# Patient Record
Sex: Female | Born: 1995 | Hispanic: Yes | Marital: Single | Smoking: Never smoker
Health system: Southern US, Community
[De-identification: ages and names within clinical notes are randomized; demographics above are authoritative.]

## PROBLEM LIST (undated history)

## (undated) DIAGNOSIS — E559 Vitamin D deficiency, unspecified: Secondary | ICD-10-CM

## (undated) DIAGNOSIS — E669 Obesity, unspecified: Secondary | ICD-10-CM

## (undated) DIAGNOSIS — G4730 Sleep apnea, unspecified: Secondary | ICD-10-CM

## (undated) HISTORY — DX: Vitamin D deficiency, unspecified: E55.9

---

## 2015-10-05 LAB — OB RESULTS CONSOLE RUBELLA ANTIBODY, IGM: RUBELLA: IMMUNE

## 2015-10-05 LAB — OB RESULTS CONSOLE GC/CHLAMYDIA
Chlamydia: NEGATIVE
Gonorrhea: NEGATIVE

## 2015-10-05 LAB — OB RESULTS CONSOLE ABO/RH: RH TYPE: POSITIVE

## 2015-10-05 LAB — OB RESULTS CONSOLE ANTIBODY SCREEN: Antibody Screen: NEGATIVE

## 2015-10-05 LAB — OB RESULTS CONSOLE RPR: RPR: NONREACTIVE

## 2015-10-05 LAB — OB RESULTS CONSOLE HIV ANTIBODY (ROUTINE TESTING): HIV: NONREACTIVE

## 2015-10-05 LAB — OB RESULTS CONSOLE HEPATITIS B SURFACE ANTIGEN: HEP B S AG: NEGATIVE

## 2016-03-16 LAB — OB RESULTS CONSOLE GBS: GBS: NEGATIVE

## 2016-04-06 ENCOUNTER — Inpatient Hospital Stay (HOSPITAL_COMMUNITY)
Admission: AD | Admit: 2016-04-06 | Discharge: 2016-04-10 | DRG: 765 | Disposition: A | Discharge status: Home / Self Care | Payer: Medicaid Other | Source: Ambulatory Visit | Attending: Obstetrics & Gynecology | Admitting: Obstetrics & Gynecology

## 2016-04-06 ENCOUNTER — Encounter (HOSPITAL_COMMUNITY): Payer: Self-pay | Admitting: *Deleted

## 2016-04-06 DIAGNOSIS — O34211 Maternal care for low transverse scar from previous cesarean delivery: Secondary | ICD-10-CM | POA: Diagnosis present

## 2016-04-06 DIAGNOSIS — Z8249 Family history of ischemic heart disease and other diseases of the circulatory system: Secondary | ICD-10-CM

## 2016-04-06 DIAGNOSIS — O99214 Obesity complicating childbirth: Secondary | ICD-10-CM | POA: Diagnosis present

## 2016-04-06 DIAGNOSIS — O4202 Full-term premature rupture of membranes, onset of labor within 24 hours of rupture: Secondary | ICD-10-CM | POA: Diagnosis present

## 2016-04-06 DIAGNOSIS — Z3483 Encounter for supervision of other normal pregnancy, third trimester: Secondary | ICD-10-CM

## 2016-04-06 DIAGNOSIS — Z3A38 38 weeks gestation of pregnancy: Secondary | ICD-10-CM

## 2016-04-06 DIAGNOSIS — O411230 Chorioamnionitis, third trimester, not applicable or unspecified: Secondary | ICD-10-CM | POA: Diagnosis present

## 2016-04-06 DIAGNOSIS — Z833 Family history of diabetes mellitus: Secondary | ICD-10-CM

## 2016-04-06 DIAGNOSIS — O4290 Premature rupture of membranes, unspecified as to length of time between rupture and onset of labor, unspecified weeks of gestation: Secondary | ICD-10-CM | POA: Diagnosis present

## 2016-04-06 DIAGNOSIS — Z98891 History of uterine scar from previous surgery: Secondary | ICD-10-CM

## 2016-04-06 DIAGNOSIS — Z6841 Body Mass Index (BMI) 40.0 and over, adult: Secondary | ICD-10-CM

## 2016-04-06 DIAGNOSIS — D649 Anemia, unspecified: Secondary | ICD-10-CM

## 2016-04-06 DIAGNOSIS — O4292 Full-term premature rupture of membranes, unspecified as to length of time between rupture and onset of labor: Secondary | ICD-10-CM

## 2016-04-06 DIAGNOSIS — E669 Obesity, unspecified: Secondary | ICD-10-CM

## 2016-04-06 HISTORY — DX: Obesity, unspecified: E66.9

## 2016-04-06 LAB — GROUP B STREP BY PCR: Group B strep by PCR: NEGATIVE

## 2016-04-06 LAB — CBC
HEMATOCRIT: 36.3 % (ref 36.0–46.0)
Hemoglobin: 11.9 g/dL — ABNORMAL LOW (ref 12.0–15.0)
MCH: 24.9 pg — AB (ref 26.0–34.0)
MCHC: 32.8 g/dL (ref 30.0–36.0)
MCV: 75.9 fL — ABNORMAL LOW (ref 78.0–100.0)
Platelets: 295 10*3/uL (ref 150–400)
RBC: 4.78 MIL/uL (ref 3.87–5.11)
RDW: 15.6 % — AB (ref 11.5–15.5)
WBC: 7.7 10*3/uL (ref 4.0–10.5)

## 2016-04-06 LAB — POCT FERN TEST

## 2016-04-06 LAB — RAPID HIV SCREEN (HIV 1/2 AB+AG)
HIV 1/2 Antibodies: NONREACTIVE
HIV-1 P24 ANTIGEN - HIV24: NONREACTIVE

## 2016-04-06 LAB — TYPE AND SCREEN
ABO/RH(D): B POS
ANTIBODY SCREEN: NEGATIVE

## 2016-04-06 LAB — ABO/RH: ABO/RH(D): B POS

## 2016-04-06 MED ORDER — ONDANSETRON HCL 4 MG/2ML IJ SOLN
4.0000 mg | Freq: Four times a day (QID) | INTRAMUSCULAR | Status: DC | PRN
Start: 2016-04-06 — End: 2016-04-08

## 2016-04-06 MED ORDER — TERBUTALINE SULFATE 1 MG/ML IJ SOLN: 0.2500 mg | Freq: Once | INTRAMUSCULAR | Status: DC | PRN

## 2016-04-06 MED ORDER — CITRIC ACID-SODIUM CITRATE 334-500 MG/5ML PO SOLN
30.0000 mL | ORAL | Status: DC | PRN
  Administered 2016-04-07: 30 mL via ORAL
  Filled 2016-04-06: qty 15

## 2016-04-06 MED ORDER — OXYTOCIN 40 UNITS IN LACTATED RINGERS INFUSION - SIMPLE MED
1.0000 m[IU]/min | INTRAVENOUS | Status: DC
  Administered 2016-04-06: 2 m[IU]/min via INTRAVENOUS
  Filled 2016-04-06: qty 1000

## 2016-04-06 MED ORDER — LACTATED RINGERS IV SOLN
500.0000 mL | INTRAVENOUS | Status: DC | PRN
  Administered 2016-04-07: 1000 mL via INTRAVENOUS

## 2016-04-06 MED ORDER — LIDOCAINE HCL (PF) 1 % IJ SOLN
30.0000 mL | INTRAMUSCULAR | Status: DC | PRN
  Filled 2016-04-06: qty 30

## 2016-04-06 MED ORDER — ACETAMINOPHEN 325 MG PO TABS
650.0000 mg | ORAL_TABLET | ORAL | Status: DC | PRN
  Administered 2016-04-07: 650 mg via ORAL
  Filled 2016-04-06 (×2): qty 2

## 2016-04-06 MED ORDER — LACTATED RINGERS IV SOLN
INTRAVENOUS | Status: DC
  Administered 2016-04-06 (×2): 125 mL/h via INTRAVENOUS
  Administered 2016-04-07 (×2): via INTRAVENOUS
  Administered 2016-04-07 (×2): 125 mL/h via INTRAVENOUS

## 2016-04-06 MED ORDER — OXYTOCIN 40 UNITS IN LACTATED RINGERS INFUSION - SIMPLE MED: 2.5000 [IU]/h | INTRAVENOUS | Status: DC

## 2016-04-06 MED ORDER — OXYTOCIN BOLUS FROM INFUSION: 500.0000 mL | INTRAVENOUS | Status: DC

## 2016-04-06 MED ORDER — FENTANYL CITRATE (PF) 100 MCG/2ML IJ SOLN
50.0000 ug | INTRAMUSCULAR | Status: DC | PRN
  Administered 2016-04-07: 100 ug via INTRAVENOUS
  Filled 2016-04-06: qty 2

## 2016-04-06 MED ORDER — PRENATAL MULTIVITAMIN CH
1.0000 | ORAL_TABLET | Freq: Every day | ORAL | Status: DC
Start: 2016-04-07 — End: 2016-04-08

## 2016-04-06 NOTE — Progress Notes (Signed)
Labor Progress Note Alexandra Gamble is a 20 y.o. G3P1011 at 7016w3d presented for PROM. S:  Doing well, no compliants, infrequent cramping.  O:  BP 117/53 mmHg  Pulse 82  Temp(Src) 97.9 F (36.6 C) (Oral)  Resp 20  Ht 5\' 6"  (1.676 m)  Wt 300 lb (136.079 kg)  BMI 48.44 kg/m2 EFM: 140/mod + a no decels Toco: infrequent  CVE: Dilation: 1 Effacement (%): 20 Cervical Position: Posterior Station: -3 Presentation: Vertex Exam by:: Shela Commons. Macklen Wilhoite, MD  @ 130pm, deferred currently    A&P: 20 y.o. G3P1011 5116w3d by 9wk scan scan who presented with PROM, now with FB in place, doing well w/o signs of labor so will start low dose pitocin for cervical ripening.  #Labor: no signs of labor s/p FB placement at 1330. Will start ripening with low dose pitocin. Anticipate natural delivery, will consider epidural. -consented for VBAC -membranes: SROM, clear fluid 0700 5.17 -start pitocin, not to exceed 536mu/m for ripening -will titrate pitocin when FB out  #FWB: category 1 strip, will start continuous monitoring with pitocin.  #GBS: negative, verified with external records and neg PCR upon admission  #Postpartum care: -feeding: breast -contraception: paraguard -vaccines: RI, VI, will ask patient about flu and tdap given none noted in prenatal records  Marina Goodellaroline Lynnex Fulp, MD 7:15 PM

## 2016-04-06 NOTE — Anesthesia Pain Management Evaluation Note (Signed)
  CRNA Pain Management Visit Note  Patient: Alexandra Gamble, 20 y.o., female  "Hello I am a member of the anesthesia team at [facility]. We have an anesthesia team available at all times to provide care throughout the hospital, including epidural management and anesthesia for C-section. I don't know your plan for the delivery whether it a natural birth, water birth, IV sedation, nitrous supplementation, doula or epidural, but we want to meet your pain goals."   1.Was your pain managed to your expectations on prior hospitalizations?   Yes   2.What is your expectation for pain management during this hospitalization?     Epidural  3.How can we help you reach that goal? Place epidural as soon as I can have it.  Record the patient's initial score and the patient's pain goal.   Pain: 2  Pain Goal: 7 [facility] wants you to be able to say your pain was always managed very well.  Margee Trentham 04/06/2016

## 2016-04-06 NOTE — H&P (Signed)
OBSTETRIC ADMISSION HISTORY AND PHYSICAL  Alexandra Gamble is a 20 y.o. female 263P1011 with IUP at 166w3d by first trimester scan presenting for VLOF. She reports +FMs,no VB.  She plans on breast feeding. She request paraguard for birth control.  Dating: By 1st trimester scan per report --->  Estimated Date of Delivery: 04/17/16  Sono:  Records not available   Prenatal History/Complications:  Past Medical History: Past Medical History  Diagnosis Date  . Obesity     Past Surgical History: Past Surgical History  Procedure Laterality Date  . Cesarean section      Obstetrical History: OB History    Gravida Para Term Preterm AB TAB SAB Ectopic Multiple Living   3 1 1  1  1   1       Social History: Social History   Social History  . Marital Status: Single    Spouse Name: N/A  . Number of Children: N/A  . Years of Education: N/A   Social History Main Topics  . Smoking status: Never Smoker   . Smokeless tobacco: None  . Alcohol Use: No  . Drug Use: No  . Sexual Activity: Not Asked   Other Topics Concern  . None   Social History Narrative  . None    Family History: Family History  Problem Relation Age of Onset  . Diabetes Maternal Grandmother   . Hypertension Maternal Grandmother   . Heart disease Maternal Grandmother     Allergies: No Known Allergies  Prescriptions prior to admission  Medication Sig Dispense Refill Last Dose  . Prenatal Vit-Fe Fumarate-FA (PRENATAL MULTIVITAMIN) TABS tablet Take 1 tablet by mouth daily at 12 noon.   04/05/2016 at Unknown time     Review of Systems   All systems reviewed and negative except as stated in HPI  Blood pressure 114/67, pulse 92, temperature 98 F (36.7 C), temperature source Oral, resp. rate 16. General appearance: alert Lungs: clear to auscultation bilaterally Heart: regular rate and rhythm Abdomen: soft, non-tender; bowel sounds normal Pelvic: deferred, completed by RN as outlined below Extremities:   no sign of DVT Presentation: cephalic by BSUS Fetal monitoring category 1 Uterine activityNone Dilation: Fingertip Effacement (%): 60 Station: -3 Exam by:: Morrison Oldee Carter RN   Prenatal labs: ABO, Rh:   Antibody:   Rubella: !Error! RPR:    HBsAg:    HIV:    GBS:    1 hr Glucola not avialable Genetic screening  Not available Anatomy US not available  Prenatal Transfer Tool  Maternal Diabetes: No Genetic Screening: unknown Maternal Ultrasounds/Referrals:  unknown Fetal Ultrasounds or other Referrals:  unknown Maternal Substance Abuse:  No Significant Maternal Medications:  None Significant Maternal Lab Results: None  Results for orders placed or performed during the hospital encounter of 04/06/16 (from the past 24 hour(s))  Fern Test   Collection Time: 04/06/16  8:57 AM  Result Value Ref Range   POCT Fern Test      There are no active problems to display for this patient.   Assessment: Alexandra Gamble is a 20 y.o. G3P1011 at 366w3d here for PROM in setting of history of C/S x1 who desires TOLAC  #Labor: TOLAC success 42% based upon exam today. Given PROM w/o labor contractions will induce with FB. Plan as follows -admit to L/D -FB placement -anticipate natural vaginal delivery, considering epidural, will get anesthesia consult -rpr/cbc/type and screen  #Pain: As above  #FWB: Category 1, 7.5lb by leopolds, vertex by BSUS -continuous monitoring  #  MOF: breast  #MOC: paraguard  #Circ:  NA  Marina Goodell, MD  04/06/2016, 11:01 AM

## 2016-04-06 NOTE — MAU Note (Signed)
Urine in lab 

## 2016-04-06 NOTE — Progress Notes (Signed)
Alexandra Gamble is a 20 y.o. G3P1011 at 8261w3d admitted for induction of labor due to PROM.  Subjective: Notes some pain with contractions. Verifies foley bulb still in place. Does not wish to use Epidural if possible. No further concerns at this time.  Objective: BP 146/86 mmHg  Pulse 89  Temp(Src) 98.6 F (37 C) (Oral)  Resp 20  Ht 5\' 6"  (1.676 m)  Wt 136.079 kg (300 lb)  BMI 48.44 kg/m2      FHT:  FHR: 150 bpm, variability: moderate,  accelerations:  Present,  decelerations:  Absent UC:   irregular SVE:   Dilation: 1 Effacement (%): 20 Station: -3 Exam by:: Shela Commons. Mullin, MD  Labs: Lab Results  Component Value Date   WBC 7.7 04/06/2016   HGB 11.9* 04/06/2016   HCT 36.3 04/06/2016   MCV 75.9* 04/06/2016   PLT 295 04/06/2016    Assessment / Plan: Induction of labor due to PROM,  progressing well on pitocin  Labor: Progressing normally. Continue current dose of Pitocin. Fetal Wellbeing:  Category I Pain Control:  Labor support without medications I/D:  n/a Anticipated MOD:  NSVD  [facility] Paolina Karwowski 04/06/2016, 11:41 PM

## 2016-04-06 NOTE — MAU Note (Addendum)
C/o ?SROM this Am around 0700; denies any pain; [place] scheduled for repeat c-section on 04/13/2016;

## 2016-04-07 ENCOUNTER — Inpatient Hospital Stay (HOSPITAL_COMMUNITY): Payer: Medicaid Other | Admitting: Anesthesiology

## 2016-04-07 ENCOUNTER — Encounter (HOSPITAL_COMMUNITY)
Admission: AD | Disposition: A | Discharge status: Home / Self Care | Payer: Self-pay | Source: Ambulatory Visit | Attending: Obstetrics & Gynecology

## 2016-04-07 ENCOUNTER — Encounter (HOSPITAL_COMMUNITY): Payer: Self-pay | Admitting: Certified Nurse Midwife

## 2016-04-07 DIAGNOSIS — O411230 Chorioamnionitis, third trimester, not applicable or unspecified: Secondary | ICD-10-CM

## 2016-04-07 DIAGNOSIS — O4202 Full-term premature rupture of membranes, onset of labor within 24 hours of rupture: Secondary | ICD-10-CM

## 2016-04-07 DIAGNOSIS — Z3A38 38 weeks gestation of pregnancy: Secondary | ICD-10-CM

## 2016-04-07 DIAGNOSIS — O99214 Obesity complicating childbirth: Secondary | ICD-10-CM

## 2016-04-07 DIAGNOSIS — O34211 Maternal care for low transverse scar from previous cesarean delivery: Secondary | ICD-10-CM

## 2016-04-07 LAB — RPR: RPR: NONREACTIVE

## 2016-04-07 SURGERY — Surgical Case
Anesthesia: Epidural

## 2016-04-07 MED ORDER — MORPHINE SULFATE (PF) 0.5 MG/ML IJ SOLN
INTRAMUSCULAR | Status: AC
  Filled 2016-04-07: qty 10

## 2016-04-07 MED ORDER — MORPHINE SULFATE (PF) 0.5 MG/ML IJ SOLN
INTRAMUSCULAR | Status: DC | PRN
  Administered 2016-04-07: 4 mg via EPIDURAL
  Administered 2016-04-07: 1 mg via INTRAVENOUS

## 2016-04-07 MED ORDER — ONDANSETRON HCL 4 MG/2ML IJ SOLN
INTRAMUSCULAR | Status: AC
  Filled 2016-04-07: qty 2

## 2016-04-07 MED ORDER — FENTANYL CITRATE (PF) 250 MCG/5ML IJ SOLN
INTRAMUSCULAR | Status: AC
  Filled 2016-04-07: qty 5

## 2016-04-07 MED ORDER — LACTATED RINGERS IV BOLUS (SEPSIS): 1000.0000 mL | Freq: Once | INTRAVENOUS | Status: DC

## 2016-04-07 MED ORDER — DEXAMETHASONE SODIUM PHOSPHATE 4 MG/ML IJ SOLN
INTRAMUSCULAR | Status: AC
  Filled 2016-04-07: qty 1

## 2016-04-07 MED ORDER — BUTORPHANOL TARTRATE 1 MG/ML IJ SOLN: 1.0000 mg | INTRAMUSCULAR | Status: DC | PRN

## 2016-04-07 MED ORDER — MEPERIDINE HCL 25 MG/ML IJ SOLN
INTRAMUSCULAR | Status: DC | PRN
  Administered 2016-04-07 (×2): 12.5 mg via INTRAVENOUS

## 2016-04-07 MED ORDER — DEXTROSE 5 % IV SOLN
3.0000 g | INTRAVENOUS | Status: DC | PRN
  Administered 2016-04-07: 3 g via INTRAVENOUS

## 2016-04-07 MED ORDER — GENTAMICIN SULFATE 40 MG/ML IJ SOLN: 5.0000 mg/kg | INTRAVENOUS | Status: DC

## 2016-04-07 MED ORDER — CEFAZOLIN SODIUM 10 G IJ SOLR
INTRAMUSCULAR | Status: AC
  Filled 2016-04-07: qty 3000

## 2016-04-07 MED ORDER — OXYTOCIN 10 UNIT/ML IJ SOLN
INTRAMUSCULAR | Status: AC
  Filled 2016-04-07: qty 4

## 2016-04-07 MED ORDER — ACETAMINOPHEN 325 MG PO TABS: 975.0000 mg | ORAL_TABLET | Freq: Three times a day (TID) | ORAL | Status: DC | PRN

## 2016-04-07 MED ORDER — MEPERIDINE HCL 25 MG/ML IJ SOLN
INTRAMUSCULAR | Status: AC
  Filled 2016-04-07: qty 1

## 2016-04-07 MED ORDER — EPHEDRINE 5 MG/ML INJ: 10.0000 mg | INTRAVENOUS | Status: DC | PRN

## 2016-04-07 MED ORDER — FENTANYL CITRATE (PF) 100 MCG/2ML IJ SOLN
INTRAMUSCULAR | Status: DC | PRN
  Administered 2016-04-07: 100 ug via EPIDURAL

## 2016-04-07 MED ORDER — LACTATED RINGERS IV SOLN: 500.0000 mL | Freq: Once | INTRAVENOUS | Status: DC

## 2016-04-07 MED ORDER — BUTORPHANOL TARTRATE 1 MG/ML IJ SOLN
1.0000 mg | INTRAMUSCULAR | Status: DC | PRN
  Administered 2016-04-07: 1 mg via INTRAVENOUS
  Filled 2016-04-07: qty 1

## 2016-04-07 MED ORDER — METHYLERGONOVINE MALEATE 0.2 MG/ML IJ SOLN
INTRAMUSCULAR | Status: DC | PRN
  Administered 2016-04-07: 0.2 mg via INTRAMUSCULAR

## 2016-04-07 MED ORDER — ACETAMINOPHEN 325 MG PO TABS
325.0000 mg | ORAL_TABLET | Freq: Once | ORAL | Status: AC
  Administered 2016-04-07: 325 mg via ORAL

## 2016-04-07 MED ORDER — DIPHENHYDRAMINE HCL 50 MG/ML IJ SOLN
12.5000 mg | INTRAMUSCULAR | Status: DC | PRN
  Administered 2016-04-07: 12.5 mg via INTRAVENOUS
  Filled 2016-04-07: qty 1

## 2016-04-07 MED ORDER — FENTANYL CITRATE (PF) 100 MCG/2ML IJ SOLN: 100.0000 ug | INTRAMUSCULAR | Status: DC | PRN

## 2016-04-07 MED ORDER — SODIUM BICARBONATE 8.4 % IV SOLN
INTRAVENOUS | Status: AC
  Filled 2016-04-07: qty 50

## 2016-04-07 MED ORDER — PHENYLEPHRINE 40 MCG/ML (10ML) SYRINGE FOR IV PUSH (FOR BLOOD PRESSURE SUPPORT)
80.0000 ug | PREFILLED_SYRINGE | INTRAVENOUS | Status: DC | PRN
  Filled 2016-04-07: qty 10

## 2016-04-07 MED ORDER — PHENYLEPHRINE 40 MCG/ML (10ML) SYRINGE FOR IV PUSH (FOR BLOOD PRESSURE SUPPORT): 80.0000 ug | PREFILLED_SYRINGE | INTRAVENOUS | Status: DC | PRN

## 2016-04-07 MED ORDER — KETOROLAC TROMETHAMINE 30 MG/ML IJ SOLN: 30.0000 mg | Freq: Four times a day (QID) | INTRAMUSCULAR | Status: DC | PRN

## 2016-04-07 MED ORDER — LIDOCAINE-EPINEPHRINE (PF) 2 %-1:200000 IJ SOLN
INTRAMUSCULAR | Status: AC
  Filled 2016-04-07: qty 20

## 2016-04-07 MED ORDER — LACTATED RINGERS IV BOLUS (SEPSIS)
1000.0000 mL | Freq: Once | INTRAVENOUS | Status: AC
  Administered 2016-04-07: 1000 mL via INTRAVENOUS

## 2016-04-07 MED ORDER — FENTANYL CITRATE (PF) 100 MCG/2ML IJ SOLN: 25.0000 ug | INTRAMUSCULAR | Status: DC | PRN

## 2016-04-07 MED ORDER — SODIUM CHLORIDE 0.9 % IR SOLN
Status: DC | PRN
  Administered 2016-04-07: 1000 mL

## 2016-04-07 MED ORDER — FENTANYL 2.5 MCG/ML BUPIVACAINE 1/10 % EPIDURAL INFUSION (WH - ANES)
14.0000 mL/h | INTRAMUSCULAR | Status: DC | PRN
  Administered 2016-04-07 (×3): 14 mL/h via EPIDURAL
  Filled 2016-04-07 (×3): qty 125

## 2016-04-07 MED ORDER — SODIUM CHLORIDE 0.9 % IV SOLN
2.0000 g | Freq: Four times a day (QID) | INTRAVENOUS | Status: DC
  Administered 2016-04-07: 2 g via INTRAVENOUS
  Filled 2016-04-07 (×5): qty 2000

## 2016-04-07 MED ORDER — DEXTROSE 5 % IV SOLN
200.0000 mg | Freq: Three times a day (TID) | INTRAVENOUS | Status: DC
  Administered 2016-04-07: 200 mg via INTRAVENOUS
  Filled 2016-04-07 (×3): qty 5

## 2016-04-07 MED ORDER — SCOPOLAMINE 1 MG/3DAYS TD PT72
MEDICATED_PATCH | TRANSDERMAL | Status: AC
  Filled 2016-04-07: qty 1

## 2016-04-07 MED ORDER — MEPERIDINE HCL 25 MG/ML IJ SOLN: 6.2500 mg | INTRAMUSCULAR | Status: DC | PRN

## 2016-04-07 MED ORDER — PROCHLORPERAZINE EDISYLATE 5 MG/ML IJ SOLN
10.0000 mg | Freq: Once | INTRAMUSCULAR | Status: DC | PRN
Start: 2016-04-07 — End: 2016-04-08

## 2016-04-07 MED ORDER — BUPIVACAINE HCL (PF) 0.5 % IJ SOLN
INTRAMUSCULAR | Status: DC | PRN
  Administered 2016-04-07: 30 mL

## 2016-04-07 MED ORDER — LIDOCAINE HCL (PF) 1 % IJ SOLN
INTRAMUSCULAR | Status: DC | PRN
  Administered 2016-04-07 (×2): 5 mL

## 2016-04-07 MED ORDER — BUPIVACAINE HCL (PF) 0.5 % IJ SOLN
INTRAMUSCULAR | Status: AC
  Filled 2016-04-07: qty 30

## 2016-04-07 MED ORDER — OXYTOCIN 10 UNIT/ML IJ SOLN
40.0000 [IU] | INTRAVENOUS | Status: DC | PRN
  Administered 2016-04-07: 40 [IU] via INTRAVENOUS

## 2016-04-07 MED ORDER — ONDANSETRON HCL 4 MG/2ML IJ SOLN
INTRAMUSCULAR | Status: DC | PRN
  Administered 2016-04-07: 4 mg via INTRAVENOUS

## 2016-04-07 MED ORDER — METHYLERGONOVINE MALEATE 0.2 MG/ML IJ SOLN
INTRAMUSCULAR | Status: AC
  Filled 2016-04-07: qty 1

## 2016-04-07 MED ORDER — SODIUM BICARBONATE 8.4 % IV SOLN
INTRAVENOUS | Status: DC | PRN
  Administered 2016-04-07: 3 mL via EPIDURAL
  Administered 2016-04-07: 2 mL via EPIDURAL
  Administered 2016-04-07 (×3): 5 mL via EPIDURAL

## 2016-04-07 SURGICAL SUPPLY — 39 items
BENZOIN TINCTURE PRP APPL 2/3 (GAUZE/BANDAGES/DRESSINGS) ×2 IMPLANT
BLADE TIP J-PLASMA PRECISE LAP (MISCELLANEOUS) ×2 IMPLANT
CHLORAPREP W/TINT 26ML (MISCELLANEOUS) ×2 IMPLANT
CLAMP CORD UMBIL (MISCELLANEOUS) ×2 IMPLANT
CLOTH BEACON ORANGE TIMEOUT ST (SAFETY) ×2 IMPLANT
DRESSING DISP NPWT PICO 4X12 (MISCELLANEOUS) ×2 IMPLANT
DRSG OPSITE POSTOP 4X10 (GAUZE/BANDAGES/DRESSINGS) ×2 IMPLANT
ELECT REM PT RETURN 9FT ADLT (ELECTROSURGICAL) ×2
ELECTRODE REM PT RTRN 9FT ADLT (ELECTROSURGICAL) ×1 IMPLANT
EXTRACTOR VACUUM KIWI (MISCELLANEOUS) IMPLANT
GLOVE BIO SURGEON STRL SZ7 (GLOVE) ×8 IMPLANT
GLOVE BIOGEL PI IND STRL 7.0 (GLOVE) ×4 IMPLANT
GLOVE BIOGEL PI INDICATOR 7.0 (GLOVE) ×4
GOWN STRL REUS W/TWL LRG LVL3 (GOWN DISPOSABLE) ×6 IMPLANT
GOWN STRL REUS W/TWL XL LVL3 (GOWN DISPOSABLE) ×2 IMPLANT
HEMOSTAT ARISTA ABSORB 3G PWDR (MISCELLANEOUS) ×2 IMPLANT
KIT ABG SYR 3ML LUER SLIP (SYRINGE) IMPLANT
NEEDLE HYPO 22GX1.5 SAFETY (NEEDLE) ×2 IMPLANT
NEEDLE HYPO 25X5/8 SAFETYGLIDE (NEEDLE) IMPLANT
NS IRRIG 1000ML POUR BTL (IV SOLUTION) ×2 IMPLANT
PACK C SECTION WH (CUSTOM PROCEDURE TRAY) ×2 IMPLANT
PAD OB MATERNITY 4.3X12.25 (PERSONAL CARE ITEMS) ×2 IMPLANT
PENCIL SMOKE EVAC W/HOLSTER (ELECTROSURGICAL) ×2 IMPLANT
RETRACTOR TRAXI PANNICULUS (MISCELLANEOUS) ×1 IMPLANT
RTRCTR C-SECT PINK 25CM LRG (MISCELLANEOUS) IMPLANT
SPONGE SURGIFOAM ABS GEL 12-7 (HEMOSTASIS) IMPLANT
STRIP CLOSURE SKIN 1/2X4 (GAUZE/BANDAGES/DRESSINGS) ×2 IMPLANT
SUT PDS AB 0 CTX 60 (SUTURE) IMPLANT
SUT PLAIN 0 NONE (SUTURE) IMPLANT
SUT PLAIN 2 0 XLH (SUTURE) ×2 IMPLANT
SUT SILK 0 TIES 10X30 (SUTURE) IMPLANT
SUT VIC AB 0 CT1 36 (SUTURE) ×10 IMPLANT
SUT VIC AB 3-0 CT1 27 (SUTURE) ×1
SUT VIC AB 3-0 CT1 TAPERPNT 27 (SUTURE) ×1 IMPLANT
SUT VIC AB 4-0 KS 27 (SUTURE) IMPLANT
SYR CONTROL 10ML LL (SYRINGE) ×2 IMPLANT
TOWEL OR 17X24 6PK STRL BLUE (TOWEL DISPOSABLE) ×2 IMPLANT
TRAXI PANNICULUS RETRACTOR (MISCELLANEOUS) ×1
TRAY FOLEY CATH SILVER 14FR (SET/KITS/TRAYS/PACK) ×2 IMPLANT

## 2016-04-07 NOTE — Anesthesia Procedure Notes (Signed)
Epidural Patient location during procedure: OB  Staffing Anesthesiologist: Phillips GroutARIGNAN, Corneshia Hines Performed by: anesthesiologist   Preanesthetic Checklist Completed: patient identified, site marked, surgical consent, pre-op evaluation, timeout performed, IV checked, risks and benefits discussed and monitors and equipment checked  Epidural Patient position: sitting Prep: DuraPrep Patient monitoring: heart rate, continuous pulse ox and blood pressure Approach: midline Location: L4-L5 Injection technique: LOR saline  Needle:  Needle type: Tuohy  Needle gauge: 17 G Needle length: 9 cm and 9 Needle insertion depth: 9 cm Catheter type: closed end flexible Catheter size: 20 Guage Catheter at skin depth: 13 cm Test dose: negative  Assessment Events: blood not aspirated, injection not painful, no injection resistance, negative IV test and no paresthesia  Additional Notes Patient identified. Risks/Benefits/Options discussed with patient including but not limited to bleeding, infection, nerve damage, paralysis, failed block, incomplete pain control, headache, blood pressure changes, nausea, vomiting, reactions to medication both or allergic, itching and postpartum back pain. Confirmed with bedside nurse the patient's most recent platelet count. Confirmed with patient that they are not currently taking any anticoagulation, have any bleeding history or any family history of bleeding disorders. Patient expressed understanding and wished to proceed. All questions were answered. Sterile technique was used throughout the entire procedure. Please see nursing notes for vital signs. Test dose was given through epidural needle and negative prior to continuing to dose epidural or start infusion. Warning signs of high block given to the patient including shortness of breath, tingling/numbness in hands, complete motor block, or any concerning symptoms with instructions to call for help. Patient was given  instructions on fall risk and not to get out of bed. All questions and concerns addressed with instructions to call with any issues.

## 2016-04-07 NOTE — Anesthesia Preprocedure Evaluation (Addendum)
Anesthesia Evaluation  Patient identified by MRN, date of birth, ID band Patient awake    Reviewed: Allergy & Precautions, H&P , NPO status , Patient's Chart, lab work & pertinent test results  History of Anesthesia Complications Negative for: history of anesthetic complications  Airway Mallampati: II  TM Distance: >3 FB Neck ROM: full    Dental no notable dental hx. (+) Teeth Intact   Pulmonary neg pulmonary ROS,    Pulmonary exam normal breath sounds clear to auscultation       Cardiovascular negative cardio ROS Normal cardiovascular exam Rhythm:regular Rate:Normal     Neuro/Psych negative neurological ROS  negative psych ROS   GI/Hepatic negative GI ROS, Neg liver ROS,   Endo/Other  Morbid obesity  Renal/GU negative Renal ROS  negative genitourinary   Musculoskeletal   Abdominal   Peds  Hematology negative hematology ROS (+)   Anesthesia Other Findings   Reproductive/Obstetrics (+) Pregnancy                             Anesthesia Physical Anesthesia Plan  ASA: III  Anesthesia Plan: Epidural   Post-op Pain Management:    Induction:   Airway Management Planned:   Additional Equipment:   Intra-op Plan:   Post-operative Plan:   Informed Consent: I have reviewed the patients History and Physical, chart, labs and discussed the procedure including the risks, benefits and alternatives for the proposed anesthesia with the patient or authorized representative who has indicated his/her understanding and acceptance.     Plan Discussed with:   Anesthesia Plan Comments: (2130: CS called for failure to progress. Epidural has been working well and will be used for CS. Discussed with patient.)       Anesthesia Quick Evaluation

## 2016-04-07 NOTE — Progress Notes (Signed)
Pharmacy Antibiotic Note  Alexandra Gamble is a 20 y.o. female 3562w4d admitted on 04/06/2016 now in active labor with Triple I. Pharmacy has been consulted for gentamicin dosing.  Plan: Start gentamicin 200 mg IV q8h Ampicillin 2 g IV q6h per MD  Monitor Scr and levels as needed   Height: 5\' 6"  (167.6 cm) Weight: 300 lb (136.079 kg) IBW/kg (Calculated) : 59.3  Temp (24hrs), Avg:98.3 F (36.8 C), Min:97.8 F (36.6 C), Max:100.3 F (37.9 C)   Recent Labs Lab 04/06/16 1130  WBC 7.7    CrCl cannot be calculated (Patient has no serum creatinine result on file.).    No Known Allergies  Antimicrobials this admission: Ampicillin 05/18 >> Gentamicin 05/18 >>    Thank you for allowing pharmacy to be a part of this patient's care.  Tawny HoppingBrittany R Jenayah Antu, PharmD Candidate  04/07/2016 2:16 PM

## 2016-04-07 NOTE — Progress Notes (Signed)
Alexandra Gamble is a 20 y.o. G3P1011 at 9363w4d admitted for induction of labor due to PROM.  Subjective: Notes some pain. Does not wish for epidural.   Objective: BP 140/80 mmHg  Pulse 105  Temp(Src) 98 F (36.7 C) (Oral)  Resp 20  Ht 5\' 6"  (1.676 m)  Wt 136.079 kg (300 lb)  BMI 48.44 kg/m2      FHT:  FHR: 140 bpm, variability: moderate,  accelerations:  Present,  decelerations:  Absent UC:   regular SVE:   Dilation: 5 Effacement (%): 60 Station: -2 Exam by:: Alexandra MallowKlashley, RN  Labs: Lab Results  Component Value Date   WBC 7.7 04/06/2016   HGB 11.9* 04/06/2016   HCT 36.3 04/06/2016   MCV 75.9* 04/06/2016   PLT 295 04/06/2016    Assessment / Plan: Induction of labor due to PROM,  progressing well on pitocin  Labor: Progressing normally. Foley bulb out. Preeclampsia:  no signs or symptoms of toxicity Fetal Wellbeing:  Category I Pain Control:  IV pain meds I/D:  n/a Anticipated MOD:  NSVD  [name] 04/07/2016, 3:02 AM

## 2016-04-07 NOTE — Progress Notes (Signed)
Alexandra Gamble is a 20 y.o. G3P1011 at 4778w4d admitted for induction of labor due to PROM.  Subjective: Continues to note significant pain. Wishes for epidural.  Objective: BP 137/94 mmHg  Pulse 126  Temp(Src) 98 F (36.7 C) (Oral)  Resp 20  Ht 5\' 6"  (1.676 m)  Wt 136.079 kg (300 lb)  BMI 48.44 kg/m2      FHT:  Off fetal monitor for epidural placement UC:  Regular SVE:   Dilation: 6 Effacement (%): 80, 90 Station: -2 Exam by:: KB Home	[place]  Labs: Lab Results  Component Value Date   WBC 7.7 04/06/2016   HGB 11.9* 04/06/2016   HCT 36.3 04/06/2016   MCV 75.9* 04/06/2016   PLT 295 04/06/2016    Assessment / Plan: Induction of labor due to PROM,  progressing well on pitocin  Labor: Progressing normally. Continue pitocin. Preeclampsia:  no signs or symptoms of toxicity Fetal Wellbeing:  Category I Pain Control:  Epidural being placed I/D:  n/a Anticipated MOD:  NSVD  [facility] Cordella Nyquist 04/07/2016, 6:44 AM

## 2016-04-07 NOTE — H&P (Signed)
Neonatology Note:   Attendance at C-section:    I was asked by Dr. Erin FullingHarraway-Smith to attend this repeat C/S at term for FTP and prolonged ROM also complicated by chorioamnionitis with Amp-Gent. The mother is a 20 y.o. female G3P1011  GBS negative with good prenatal care. ROM ~39h hours before delivery, fluid clear. Infant vigorous with good spontaneous cry and tone. Needed only minimal bulb suctioning. Ap 8/9. Lungs clear to ausc in DR. EOS Risk @ Birth is 1.60; low threshold for starting antibiotics if clinical concerns. To CN to care of Pediatrician.  Dineen Kidavid C. Leary RocaEhrmann, MD

## 2016-04-07 NOTE — Progress Notes (Signed)
Labor Progress Note Alexandra Gamble is a 20 y.o. G3P1011 at 7326w4d presented for PROM, now in induced latent labor. Pt is TOLACing.  S:  Doing well s/p epidural, no complaints  O:  BP 128/76 mmHg  Pulse 92  Temp(Src) 97.8 F (36.6 C) (Oral)  Resp 18  Ht 5\' 6"  (1.676 m)  Wt 300 lb (136.079 kg)  BMI 48.44 kg/m2  SpO2 95% EFM: 140/mod var/+accels, no decels Ctx: q2-8075m  CVE: Dilation: 6 Effacement (%): 90 Cervical Position: Posterior Station: -2 Presentation: Vertex Exam by:: Dr. Burton ApleyMullin IUPC placed  A&P: 20 y.o. [id number] 5226w4d who presented with PROM, now in induced latent labor w protracted labor course so IUPC was successfully placed.  #Labor/TOLAC: induced latent labor and labor curve is protracted given no cervical change for 4 hours. Suspect protraction related to inadequate contractions though tocometry was insufficient to assess frequency of contractions. As such IUPC placed and will continue titrate pitocin. -titrate pitocin, RN to notify MD if 20 is reach -will monitor MVUs -s/p epidural for pain  #FWB: category 1  #GBS: negative   Alexandra Goodellaroline Kennita Pavlovich, MD 10:13 AM

## 2016-04-07 NOTE — Brief Op Note (Signed)
04/06/2016 - 04/07/2016  11:50 PM  PATIENT:  Alexandra Gamble  20 y.o. female  PRE-OPERATIVE DIAGNOSIS:  Failure to progress; Failed induction; Suspected Triple I; Repeat cesarean section  POST-OPERATIVE DIAGNOSIS:  Failure to progress; Failed induction; Suspected Triple I; Repeat cesarean section  PROCEDURE:  Procedure(s): CESAREAN SECTION (N/A)  SURGEON:  Surgeon(s) and Role:    * Willodean Rosenthalarolyn Harraway-Smith, MD - Primary    * Federico FlakeKimberly Niles Newton, MD - Assisting  ANESTHESIA:   epidural  EBL:  Total I/O In: 1800 [I.V.:1800] Out: 2000 [Urine:900; Blood:1100]  BLOOD ADMINISTERED:none  DRAINS: none   LOCAL MEDICATIONS USED:  MARCAINE     SPECIMEN:  Source of Specimen:  placenta  DISPOSITION OF SPECIMEN:  PATHOLOGY  COUNTS:  YES  TOURNIQUET:  * No tourniquets in log *  DICTATION: .Note written in EPIC  PLAN OF CARE: Admit to inpatient   PATIENT DISPOSITION:  PACU - hemodynamically stable.   Delay start of Pharmacological VTE agent (>24hrs) due to surgical blood loss or risk of bleeding: yes   Suliman Termini L. Harraway-Smith, M.D., Evern CoreFACOG

## 2016-04-07 NOTE — Op Note (Signed)
04/06/2016 - 04/07/2016  11:50 PM  PATIENT:  Alexandra Gamble  20 y.o. female  PRE-OPERATIVE DIAGNOSIS:  Failure to progress; Failed induction; Suspected Triple I; Repeat cesarean section  POST-OPERATIVE DIAGNOSIS:  Failure to progress; Failed induction; Suspected Triple I; Repeat cesarean section  PROCEDURE:  Procedure(s): CESAREAN SECTION (N/A)  SURGEON:  Surgeon(s) and Role:    * Willodean Rosenthal, MD - Primary    * Federico Flake, MD - Assisting  ANESTHESIA:   epidural  EBL:  Total I/O In: 1800 [I.V.:1800] Out: 2000 [Urine:900; Blood:1100]  BLOOD ADMINISTERED:none  DRAINS: none   LOCAL MEDICATIONS USED:  MARCAINE     SPECIMEN:  Source of Specimen:  placenta  DISPOSITION OF SPECIMEN:  PATHOLOGY  COUNTS:  YES  TOURNIQUET:  * No tourniquets in log *  DICTATION: .Note written in EPIC  PLAN OF CARE: Admit to inpatient   PATIENT DISPOSITION:  PACU - hemodynamically stable.   Delay start of Pharmacological VTE agent (>24hrs) due to surgical blood loss or risk of bleeding: yes  Complications: none immediate   INDICATIONS: [name] is a 20 y.o. G3P2011 at [redacted]w[redacted]d here for cesarean section secondary to the indications listed under preoperative diagnosis; please see preoperative note for further details.  The risks of cesarean section were discussed with the patient including but were not limited to: bleeding which may require transfusion or reoperation; infection which may require antibiotics; injury to bowel, bladder, ureters or other surrounding organs; injury to the fetus; need for additional procedures including hysterectomy in the event of a life-threatening hemorrhage; placental abnormalities wth subsequent pregnancies, incisional problems, thromboembolic phenomenon and other postoperative/anesthesia complications.   The patient concurred with the proposed plan, giving informed written consent for the procedure.    FINDINGS:  Viable female infant in  cephalic presentation.  Apgars pending.  Cloudy amniotic fluid.  Intact placenta, three vessel cord.  Normal uterus, fallopian tubes and ovaries bilaterally.  PROCEDURE IN DETAIL:  The patient preoperatively received intravenous antibiotics and had sequential compression devices applied to her lower extremities.  She was then taken to the operating room where spinal anesthesia was administered and was found to be adequate. She was then placed in a dorsal supine position with a leftward tilt, and prepped and draped in a sterile manner.  A foley catheter was placed into her bladder and attached to constant gravity.  After an adequate timeout was performed, a Pfannenstiel skin incision was made with scalpel and carried through to the underlying layer of fascia. The fascia was incised in the midline, and this incision was extended bilaterally using the Mayo scissors.  Kocher clamps were applied to the superior aspect of the fascial incision and the underlying rectus muscles were dissected off bluntly. A similar process was carried out on the inferior aspect of the fascial incision. The rectus muscles were separated in the midline bluntly and the peritoneum was entered bluntly.  Attention was turned to the lower uterine segment where a low transverse hysterotomy incision was made with a scalpel and extended bilaterally bluntly.  The infant was successfully delivered, the cord was clamped and cut and the infant was handed over to awaiting neonatology team. The placenta was delivered manually. Uterine massage was then administered.  The uterus was slightly boggy so Methergine was given by anesthesia.  The placenta was intact with a three-vessel cord. The uterus was then cleared of clot and debris.  The hysterotomy was closed with 0 Vicryl in a running locked fashion, there  was an extension of the right lower uterine incision which was bleeding.  This was grasped with Alis clamps and reapproximated using several  figure-of-eight sutures until hemostasis was achieved.  An imbricating layer of 0 vicryl was placed. The uterus was returned to the pelvis. The pelvis was cleared of all clot and debris. Hemostasis was confirmed on all surfaces. Arista was placed over the uterine incision.  The peritoneum and the muscles were reapproximated using 0 Vicryl with 1 interrupted suture. The fascia was then closed using 0 Vicryl.  The subcutaneous layer was irrigated, then reapproximated with 3-0 vicryl in a running fashion, and the skin was closed with a 4-0 Vicryl subcuticular stitch.  30 cc of 0.5% marcaine was injected into the incision.  A PICO wound vac was placed over the incision. The patient tolerated the procedure well. Sponge, lap, instrument and needle counts were correct x 2.  She was taken to the recovery room in stable condition.   [name], M.D., Evern CoreFACOG

## 2016-04-07 NOTE — Progress Notes (Signed)
Labor Progress Note Alexandra Gamble is a 20 y.o. G3P1011 at 4517w4d presented for PROM, now in induced active labor.   S:  Shaking is persistent. No other concerns.   O:  BP 140/62 mmHg  Pulse 138  Temp(Src) 100.3 F (37.9 C) (Axillary)  Resp 20  Ht 5\' 6"  (1.676 m)  Wt 300 lb (136.079 kg)  BMI 48.44 kg/m2  SpO2 95% EFM: 180 mod var, + a, no decels  Ctx: q 2-4422m, MVU < 200  CVE: Dilation: 8 Effacement (%): 90 Cervical Position: Posterior Station: -1 Presentation: Vertex Exam by:: Valentina Lucks. Woods, RN   A&P: 20 y.o. [id number] 6317w4d who p/w PROM, now in induced active labor with maternal tachycardia, fetal tachycardia and elevated axillary temperature consistent with chorioamnionitis.  #Chorioamnionitis: acute, dx based upon fetal and maternal tachycardia coupled with maternal axillary temp of 100.18F. Plan as follow: -1L LR x1 now, then reassess -start amp 2g q6h -gent per pharm dosing q8h  #Labor/TOLAC: labor curve reviewed and appropriate. Ctx not adequate after decreased pit dosing so will plan to inc pitocin until MVU >200.  -continue to titrate pitocin, RN will notify MD if max at 20 -epidural in place -anticipate NSVD -continue IUPC  #FWB: category II given fetal tachycardia, likely 2/2 chorio and dehydration so will tx chorio as outlined above -continue FSE  #GBS: neg  Marina Goodellaroline Mullin, MD 1:38 PM  OB fellow attestation: I have seen and examined this patient; I agree with above documentation in the resident's note.  Agree with diagnosis of Triple I given maternal/fetal tachycardia and fever. Agree with of ABX treatment, fluid resuscitation and antipyretics,.   Federico FlakeKimberly Niles Kenzel Ruesch, MD 4:10 PM

## 2016-04-07 NOTE — Progress Notes (Signed)
Alexandra Gamble is a 20 y.o. G3P1011 at 1069w4d by LMP admitted for PROM  Subjective:  Pt without complaints.  Very comfortable.  Objective: BP 112/54 mmHg  Pulse 109  Temp(Src) 99.2 F (37.3 C) (Axillary)  Resp 20  Ht 5\' 6"  (1.676 m)  Wt 300 lb (136.079 kg)  BMI 48.44 kg/m2  SpO2 95%   Total I/O In: -  Out: 200 [Urine:200]  FHT:  FHR: 150's bpm, variability: moderate,  accelerations:  Present,  decelerations:  Present Cat I UC:   regular, every 2-3 minutes SVE:   Dilation: 8.5 Effacement (%): 90 Station: -2, -3 Exam by:: Dr. Erin FullingHarraway-Smith  Labs: Lab Results  Component Value Date   WBC 7.7 04/06/2016   HGB 11.9* 04/06/2016   HCT 36.3 04/06/2016   MCV 75.9* 04/06/2016   PLT 295 04/06/2016    Assessment / Plan: Protracted active phase.  Will recheck 2 more hours.   Fetal Wellbeing:  Category I Pain Control:  Epidural I/D:  n/a Anticipated MOD:  Pt with protracted labor complicated by Triple I.  If FHR remains stable will recheck in 2 hours and if no decent will proceed to operative delivery.Marland Kitchen.  HARRAWAY-SMITH, Alexandra Gamble 04/07/2016, 5:59 PM

## 2016-04-08 ENCOUNTER — Encounter (HOSPITAL_COMMUNITY): Payer: Self-pay

## 2016-04-08 LAB — CBC
HEMATOCRIT: 30.4 % — AB (ref 36.0–46.0)
HEMOGLOBIN: 9.7 g/dL — AB (ref 12.0–15.0)
MCH: 24.4 pg — ABNORMAL LOW (ref 26.0–34.0)
MCHC: 31.9 g/dL (ref 30.0–36.0)
MCV: 76.4 fL — ABNORMAL LOW (ref 78.0–100.0)
Platelets: 273 10*3/uL (ref 150–400)
RBC: 3.98 MIL/uL (ref 3.87–5.11)
RDW: 16 % — AB (ref 11.5–15.5)
WBC: 21.8 10*3/uL — AB (ref 4.0–10.5)

## 2016-04-08 MED ORDER — ACETAMINOPHEN 500 MG PO TABS
1000.0000 mg | ORAL_TABLET | Freq: Four times a day (QID) | ORAL | Status: AC
  Administered 2016-04-08 (×2): 1000 mg via ORAL
  Filled 2016-04-08 (×3): qty 2

## 2016-04-08 MED ORDER — COCONUT OIL OIL
1.0000 | TOPICAL_OIL | Status: DC | PRN
Start: 2016-04-08 — End: 2016-04-10

## 2016-04-08 MED ORDER — DIPHENHYDRAMINE HCL 25 MG PO CAPS: 25.0000 mg | ORAL_CAPSULE | ORAL | Status: DC | PRN

## 2016-04-08 MED ORDER — SENNOSIDES-DOCUSATE SODIUM 8.6-50 MG PO TABS
2.0000 | ORAL_TABLET | ORAL | Status: DC
  Administered 2016-04-08 – 2016-04-10 (×2): 2 via ORAL
  Filled 2016-04-08 (×2): qty 2

## 2016-04-08 MED ORDER — SCOPOLAMINE 1 MG/3DAYS TD PT72
1.0000 | MEDICATED_PATCH | Freq: Once | TRANSDERMAL | Status: DC
  Filled 2016-04-08: qty 1

## 2016-04-08 MED ORDER — NALBUPHINE HCL 10 MG/ML IJ SOLN: 5.0000 mg | Freq: Once | INTRAMUSCULAR | Status: DC | PRN

## 2016-04-08 MED ORDER — LACTATED RINGERS IV SOLN: INTRAVENOUS | Status: DC

## 2016-04-08 MED ORDER — NALBUPHINE HCL 10 MG/ML IJ SOLN: 5.0000 mg | INTRAMUSCULAR | Status: DC | PRN

## 2016-04-08 MED ORDER — SIMETHICONE 80 MG PO CHEW
80.0000 mg | CHEWABLE_TABLET | ORAL | Status: DC
  Administered 2016-04-08 – 2016-04-10 (×2): 80 mg via ORAL
  Filled 2016-04-08 (×2): qty 1

## 2016-04-08 MED ORDER — OXYTOCIN 40 UNITS IN LACTATED RINGERS INFUSION - SIMPLE MED: 2.5000 [IU]/h | INTRAVENOUS | Status: AC

## 2016-04-08 MED ORDER — SIMETHICONE 80 MG PO CHEW
80.0000 mg | CHEWABLE_TABLET | Freq: Three times a day (TID) | ORAL | Status: DC
  Administered 2016-04-08 – 2016-04-10 (×7): 80 mg via ORAL
  Filled 2016-04-08 (×7): qty 1

## 2016-04-08 MED ORDER — DIPHENHYDRAMINE HCL 25 MG PO CAPS: 25.0000 mg | ORAL_CAPSULE | Freq: Four times a day (QID) | ORAL | Status: DC | PRN

## 2016-04-08 MED ORDER — ACETAMINOPHEN 325 MG PO TABS: 650.0000 mg | ORAL_TABLET | ORAL | Status: DC | PRN

## 2016-04-08 MED ORDER — IBUPROFEN 600 MG PO TABS
600.0000 mg | ORAL_TABLET | Freq: Four times a day (QID) | ORAL | Status: DC
  Administered 2016-04-08 – 2016-04-10 (×9): 600 mg via ORAL
  Filled 2016-04-08 (×9): qty 1

## 2016-04-08 MED ORDER — TETANUS-DIPHTH-ACELL PERTUSSIS 5-2.5-18.5 LF-MCG/0.5 IM SUSP: 0.5000 mL | Freq: Once | INTRAMUSCULAR | Status: DC

## 2016-04-08 MED ORDER — MENTHOL 3 MG MT LOZG: 1.0000 | LOZENGE | OROMUCOSAL | Status: DC | PRN

## 2016-04-08 MED ORDER — NALOXONE HCL 0.4 MG/ML IJ SOLN: 0.4000 mg | INTRAMUSCULAR | Status: DC | PRN

## 2016-04-08 MED ORDER — DEXAMETHASONE SODIUM PHOSPHATE 4 MG/ML IJ SOLN
INTRAMUSCULAR | Status: DC | PRN
  Administered 2016-04-07: 4 mg via INTRAVENOUS

## 2016-04-08 MED ORDER — SCOPOLAMINE 1 MG/3DAYS TD PT72
MEDICATED_PATCH | TRANSDERMAL | Status: DC | PRN
  Administered 2016-04-07: 1 via TRANSDERMAL

## 2016-04-08 MED ORDER — DIBUCAINE 1 % RE OINT: 1.0000 "application " | TOPICAL_OINTMENT | RECTAL | Status: DC | PRN

## 2016-04-08 MED ORDER — ONDANSETRON HCL 4 MG/2ML IJ SOLN: 4.0000 mg | Freq: Three times a day (TID) | INTRAMUSCULAR | Status: DC | PRN

## 2016-04-08 MED ORDER — NALOXONE HCL 2 MG/2ML IJ SOSY
1.0000 ug/kg/h | PREFILLED_SYRINGE | INTRAVENOUS | Status: DC | PRN
  Filled 2016-04-08: qty 2

## 2016-04-08 MED ORDER — PRENATAL MULTIVITAMIN CH
1.0000 | ORAL_TABLET | Freq: Every day | ORAL | Status: DC
  Administered 2016-04-08 – 2016-04-10 (×3): 1 via ORAL
  Filled 2016-04-08 (×3): qty 1

## 2016-04-08 MED ORDER — WITCH HAZEL-GLYCERIN EX PADS: 1.0000 "application " | MEDICATED_PAD | CUTANEOUS | Status: DC | PRN

## 2016-04-08 MED ORDER — OXYCODONE-ACETAMINOPHEN 5-325 MG PO TABS
2.0000 | ORAL_TABLET | ORAL | Status: DC | PRN
  Administered 2016-04-08 – 2016-04-09 (×4): 2 via ORAL
  Filled 2016-04-08 (×4): qty 2

## 2016-04-08 MED ORDER — SIMETHICONE 80 MG PO CHEW: 80.0000 mg | CHEWABLE_TABLET | ORAL | Status: DC | PRN

## 2016-04-08 MED ORDER — ZOLPIDEM TARTRATE 5 MG PO TABS: 5.0000 mg | ORAL_TABLET | Freq: Every evening | ORAL | Status: DC | PRN

## 2016-04-08 MED ORDER — SODIUM CHLORIDE 0.9% FLUSH: 3.0000 mL | INTRAVENOUS | Status: DC | PRN

## 2016-04-08 MED ORDER — MEASLES, MUMPS & RUBELLA VAC ~~LOC~~ INJ
0.5000 mL | INJECTION | Freq: Once | SUBCUTANEOUS | Status: DC
  Filled 2016-04-08: qty 0.5

## 2016-04-08 MED ORDER — DIPHENHYDRAMINE HCL 50 MG/ML IJ SOLN: 12.5000 mg | INTRAMUSCULAR | Status: DC | PRN

## 2016-04-08 MED ORDER — OXYCODONE-ACETAMINOPHEN 5-325 MG PO TABS: 1.0000 | ORAL_TABLET | ORAL | Status: DC | PRN

## 2016-04-08 NOTE — Lactation Note (Signed)
This note was copied from a baby's chart. Lactation Consultation Note  Patient Name: Alexandra Gamble ZOXWR'UToday's Date: 04/08/2016 Reason for consult: Follow-up assessment Mom reports she thinks this baby is latching well. Baby giving feeding ques, Mom attempted to latch but baby became spitty and would not latch. Basic teaching reviewed with Mom, encouraged to BF with feeding ques. Cluster feeding discussed. Mom to call for assist as needed.   Maternal Data Has patient been taught Hand Expression?: No (Mom reports she knows how to hand express) Does the patient have breastfeeding experience prior to this delivery?: Yes  Feeding Feeding Type: Breast Fed Length of feed: 0 min  LATCH Score/Interventions Latch: Grasps breast easily, tongue down, lips flanged, rhythmical sucking.  Audible Swallowing: Spontaneous and intermittent  Type of Nipple: Flat  Comfort (Breast/Nipple): Soft / non-tender     Hold (Positioning): Assistance needed to correctly position infant at breast and maintain latch.  LATCH Score: 8  Lactation Tools Discussed/Used     Consult Status Consult Status: Follow-up Date: 04/09/16 Follow-up type: In-patient    Alfred LevinsGranger, Izel Eisenhardt Ann 04/08/2016, 7:30 PM

## 2016-04-08 NOTE — Anesthesia Postprocedure Evaluation (Signed)
Anesthesia Post Note  Patient: Alexandra Gamble  Procedure(s) Performed: Procedure(s) (LRB): CESAREAN SECTION (N/A)  Patient location during evaluation: Mother Baby Anesthesia Type: Epidural Level of consciousness: oriented and awake and alert Pain management: pain level controlled Vital Signs Assessment: post-procedure vital signs reviewed and stable Respiratory status: spontaneous breathing and nonlabored ventilation Cardiovascular status: stable Postop Assessment: epidural receding, patient able to bend at knees, no signs of nausea or vomiting and adequate PO intake Anesthetic complications: no     Last Vitals:  Filed Vitals:   04/08/16 0500 04/08/16 0851  BP: 134/74 112/64  Pulse: 80 77  Temp: 36.6 C 36.5 C  Resp: 18 24    Last Pain:  Filed Vitals:   04/08/16 0901  PainSc: 0-No pain   Pain Goal: Patients Stated Pain Goal: 7 (04/07/16 0800)               Laban EmperorMalinova,Ketty Bitton Hristova

## 2016-04-08 NOTE — Addendum Note (Signed)
Addendum  created 04/08/16 0905 by Elgie CongoNataliya H Meng [place], CRNA   Modules edited: Charges VN, Clinical Notes   Clinical Notes:  File: [id number]

## 2016-04-08 NOTE — Progress Notes (Signed)
UR chart review completed.  

## 2016-04-08 NOTE — Transfer of Care (Signed)
Immediate Anesthesia Transfer of Care Note  Patient: Alexandra Gamble  Procedure(s) Performed: Procedure(s): CESAREAN SECTION (N/A)  Patient Location: PACU  Anesthesia Type:Epidural  Level of Consciousness: awake, alert  and oriented  Airway & Oxygen Therapy: Patient Spontanous Breathing  Post-op Assessment: Report given to RN and Post -op Vital signs reviewed and stable  Post vital signs: Reviewed and stable  Last Vitals:  Filed Vitals:   04/07/16 2100 04/07/16 2130  BP: 140/71 124/72  Pulse: 98 97  Temp: 36.8 C   Resp: 18     Last Pain:  Filed Vitals:   04/07/16 2329  PainSc: Asleep      Patients Stated Pain Goal: 7 (04/07/16 0800)  Complications: No apparent anesthesia complications

## 2016-04-08 NOTE — Anesthesia Postprocedure Evaluation (Signed)
Anesthesia Post Note  Patient: Alexandra Gamble  Procedure(s) Performed: Procedure(s) (LRB): CESAREAN SECTION (N/A)  Patient location during evaluation: PACU Anesthesia Type: Epidural Level of consciousness: oriented and awake and alert Pain management: pain level controlled Vital Signs Assessment: post-procedure vital signs reviewed and stable Respiratory status: spontaneous breathing, respiratory function stable and patient connected to nasal cannula oxygen Cardiovascular status: blood pressure returned to baseline and stable Postop Assessment: no headache, no backache, epidural receding and patient able to bend at knees Anesthetic complications: no     Last Vitals:  Filed Vitals:   04/08/16 0100 04/08/16 0115  BP: 117/90 127/76  Pulse: 85 81  Temp: 37.2 C   Resp: 25 25    Last Pain:  Filed Vitals:   04/08/16 0122  PainSc: 0-No pain   Pain Goal: Patients Stated Pain Goal: 7 (04/07/16 0800)               Findley Vi J

## 2016-04-08 NOTE — Lactation Note (Signed)
This note was copied from a baby's chart. RN called to room for Mom's concern she "doesn't have any milk."  Hand expression done, good flow, 4 mls of colostrum expressed. LEAD explained to Mom, encouragement given to continue to exclusively breast feed, Mom states she would still like to breast feed only.  Baby latched to breast with deeper latch than Mom had been doing, Mom expressed that she felt the difference but had been afraid baby would suffocate.  Importance of deep latch taught.

## 2016-04-08 NOTE — Progress Notes (Signed)
Post Partum Day 1 Subjective:  Alexandra Gamble is a 20 y.o. G3P2011 4933w4d s/p rLTCS at 2300 on 5/18 following failed TOLAC and Chorio.  No acute events overnight.  Pt denies problems with ambulating, or po intake.  Foley catheter still in place. She denies nausea or vomiting.  Pain is moderately controlled.  She has not had flatus.  Lochia Moderate.  Plan for birth control is Paraguard.  Method of Feeding: Breast.  Objective: Blood pressure 112/64, pulse 77, temperature 97.7 F (36.5 C), temperature source Axillary, resp. rate 24, height 5\' 6"  (1.676 m), weight 136.079 kg (300 lb), SpO2 96 %, unknown if currently breastfeeding.  Physical Exam:  General: alert, cooperative and no distress Lochia:normal flow Chest: normal WOB Heart: Regular rate/rhythm, no murmurs Abdomen: +BS, soft, mild TTP (appropriate), PICO in place lower abdomen Uterine Fundus: firm DVT Evaluation: No evidence of DVT seen on physical exam Extremities: SCD's in place bilaterally   Recent Labs  04/06/16 1130 04/08/16 0619  HGB 11.9* 9.7*  HCT 36.3 30.4*    Assessment/Plan:  ASSESSMENT: Alexandra Gamble is a 20 y.o. G3P2011 4333w4d s/p rLTCS following failed TOLAC and chorio.  #Pain Control - Acetaminophen 1000mg  q6hr - Ibuprofen 600mg  q6hr  #FEN/GI - Normal Diet  #postpartum care: -Continue routine PP care -Breastfeeding support PRN - Lactation Consult - Contraception (Paraguard) -Anticipate d/c pod 2-3  Breastfeeding, Lactation consult and Contraception (Paraguard) Continue routine PP care Breastfeeding support PRN  LOS: 2 days   Barnie Mortolleen McGuire  CNM attestation Post Partum Day #1  Alexandra Gamble is a 20 y.o. [id number] s/p rLTCS for FTP/failed TOLAC.  Pt denies problems with ambulating, voiding or po intake. Pain is well controlled.  Plan for birth control is IUD.  Method of Feeding: breast  PE:  BP 112/64 mmHg  Pulse 77  Temp(Src) 97.7 F (36.5 C) (Axillary)  Resp 24  Ht 5\' 6"   (1.676 m)  Wt 136.079 kg (300 lb)  BMI 48.44 kg/m2  SpO2 96%  Breastfeeding? Unknown Fundus firm Inc: PICO in place and functioning; sm staining  Plan for discharge: 5/20  Cam HaiSHAW, Remmy Crass, CNM 1:47 PM  04/08/2016

## 2016-04-09 NOTE — Progress Notes (Addendum)
Post Partum Day 2 Subjective:  Alexandra Gamble is a 20 y.o. G3P2011 2257w4d POD#2 from rLTCS following failed TOLAC and Chorio.  No acute events overnight.  Pt denies problems with ambulating, voiding or po intake.  She denies nausea or vomiting.  Pain is well controlled.  She has had flatus.  Lochia Small.  Plan for birth control is Paragaurd.  Method of Feeding: Breast - breast feeding is going well.   Objective: Blood pressure 131/57, pulse 97, temperature 97.8 F (36.6 C), temperature source Oral, resp. rate 18, height 5\' 6"  (1.676 m), weight 136.079 kg (300 lb), SpO2 94 %, unknown if currently breastfeeding.  Physical Exam:  General: alert, cooperative and no distress. Breastfeeding with baby on bili-lights. Lochia:normal flow Chest: normal WOB  Heart: Regular rate/rhythm, no murmurs Abdomen: +BS, soft, mild TTP (appropriate). PICO in place lower abdomen c/d//i Uterine Fundus: firm DVT Evaluation: No evidence of DVT seen on physical exam. Extremities: mild edema  Assessment/Plan:  ASSESSMENT: Alexandra Gamble is a 20 y.o. [id number] 5357w4d s/p rLTCS following failed TOLAC and chorio, doing well.  #Pain Control - Acetaminophen 1000mg  q6hr - Ibuprofen 600mg  q6hr  #FEN/GI - Normal Diet  #postpartum care: -Continue routine PP care -Breastfeeding support PRN - Contraception (Paraguard) -Anticipate d/c tomorrow  Breastfeeding, Lactation consult and Contraception (Paraguard) Continue routine PP care Breastfeeding support PRN  LOS: 3 days   Barnie Mortolleen McGuire, Med Student  CNM attestation Post Partum Day #2  Alexandra Gamble is a 20 y.o. G3P2011 s/p rLTCS.  Pt denies problems with ambulating, voiding or po intake. Pain is well controlled.  Plan for birth control is IUD.  Method of Feeding: breast  PE:  BP 131/57 mmHg  Pulse 97  Temp(Src) 97.8 F (36.6 C) (Oral)  Resp 18  Ht 5\' 6"  (1.676 m)  Wt 136.079 kg (300 lb)  BMI 48.44 kg/m2  SpO2 94%  Breastfeeding?  Unknown Fundus firm Inc: PICO marked and unchanged from yesterday  Plan for discharge: 04/10/16  Cam HaiSHAW, KIMBERLY, CNM 9:06 AM  04/09/2016

## 2016-04-09 NOTE — Lactation Note (Signed)
This note was copied from a baby's chart. Lactation Consultation Note  Baby  40 hours and sleeping under double phototherapy lights. Baby had been exclusively breastfed until recently had 20 ml of formula. Mother states she did not think the colostrum was enough for the baby because baby still seems hungry after breastfeeding when she puts her in her crib. Described cluster feeding, supply and demand. Mom encouraged to feed baby 8-12 times/24 hours and with feeding cues.  Suggest she call if she needs assistance w/ breastfeeding.    Patient Name: Alexandra Gamble QMVHQ'IToday's Date: 04/09/2016 Reason for consult: Follow-up assessment   Maternal Data    Feeding Feeding Type: Formula Length of feed: 90 min  LATCH Score/Interventions Latch: Grasps breast easily, tongue down, lips flanged, rhythmical sucking. Intervention(s): Skin to skin  Audible Swallowing: Spontaneous and intermittent Intervention(s): Skin to skin;Hand expression Intervention(s): Alternate breast massage  Type of Nipple: Everted at rest and after stimulation  Comfort (Breast/Nipple): Filling, red/small blisters or bruises, mild/mod discomfort  Problem noted: Mild/Moderate discomfort  Hold (Positioning): Assistance needed to correctly position infant at breast and maintain latch.  LATCH Score: 8  Lactation Tools Discussed/Used     Consult Status Consult Status: Follow-up Date: 04/10/16 Follow-up type: In-patient    Dahlia ByesBerkelhammer, Norely Schlick [facility] 04/09/2016, 3:05 PM

## 2016-04-10 MED ORDER — SENNOSIDES-DOCUSATE SODIUM 8.6-50 MG PO TABS: 2.0000 | ORAL_TABLET | ORAL | Status: DC

## 2016-04-10 MED ORDER — OXYCODONE-ACETAMINOPHEN 5-325 MG PO TABS: 1.0000 | ORAL_TABLET | ORAL | Status: DC | PRN

## 2016-04-10 NOTE — Discharge Summary (Signed)
Obstetric Discharge Summary Reason for Admission: rupture of membranes Prenatal Procedures: none Intrapartum Procedures: cesarean: low cervical, transverse due to failure to progress, failed TOLAC Postpartum Procedures: none Complications-Operative and Postpartum: PICO placed. Chorioamnionitis, completed course of Amp/Gent.  Please refer to op note from 5/19.  Hospital Course:  Active Problems:   PROM (premature rupture of membranes)   Alexandra Gamble is a 20 y.o. [id number] s/p c-section for failure to progress, failed TOLAC.  Patient was admitted following rupture of membranes.  She has postpartum course that was uncomplicated including no problems with ambulating, PO intake, urination, pain, or bleeding. The pt feels ready to go home and  will be discharged with outpatient follow-up.   Today: No acute events overnight.  Pt denies problems with ambulating, voiding or po intake.  She denies nausea or vomiting.  Pain is well controlled.  She has had flatus. She has not had bowel movement.  Lochia Minimal.  Plan for birth control is  IUD.  Method of Feeding: Breast  Physical Exam:  General: alert, cooperative and no distress Lochia: appropriate Uterine Fundus: firm Incision: healing well DVT Evaluation: No evidence of DVT seen on physical exam.  H/H: Lab Results  Component Value Date/Time   HGB 9.7* 04/08/2016 06:19 AM   HCT 30.4* 04/08/2016 06:19 AM    Discharge Diagnoses: Term Pregnancy-delivered  Discharge Information: Date: 04/10/2016 Activity: pelvic rest Diet: routine  Medications: Percocet Breast feeding:  Yes Condition: stable Instructions: refer to handout. To remove PICO in 4 days. Follow up in [facility] in 1 week. Discharge to: home      Medication List    TAKE these medications        oxyCODONE-acetaminophen 5-325 MG tablet  Commonly known as:  PERCOCET/ROXICET  Take 1 tablet by mouth every 4 (four) hours as needed (pain scale 4-7).     prenatal  multivitamin Tabs tablet  Take 1 tablet by mouth daily at 12 noon.     senna-docusate 8.6-50 MG tablet  Commonly known as:  Senokot-S  Take 2 tablets by mouth daily.       Follow-up Information    [facility] Schedule an appointment as soon as possible for a visit in 1 week.   Specialty:  Obstetrics and Gynecology   Contact information:   [address] [place] [id number] [phone]      [place], [place] 04/10/2016,9:19 AM  OB FELLOW DISCHARGE ATTESTATION  I have seen and examined this patient and agree with above documentation in the resident's note.   Silvano BilisNoah B Cayle Cordoba, MD 12:32 PM

## 2016-04-10 NOTE — Progress Notes (Signed)
Post Partum Day 3 Subjective:  Shalisa A Baldwin JamaicaVasquez is a 20 y.o. G3P2011 6312w4d POD#2 from rLTCS following failed TOLAC and Chorio.  No acute events overnight.  Pt denies problems with ambulating, voiding or po intake.  She denies nausea or vomiting.  Pain is well controlled.  She has had flatus.  Lochia Small.  Plan for birth control is IUD.  Method of Feeding: Breast. Notes some frustration with breast feeding due to large amount that baby is drinking, stating she is also supplementing with bottles.   Objective: Blood pressure 141/82, pulse 96, temperature 98.2 F (36.8 C), temperature source Oral, resp. rate 18, height 5\' 6"  (1.676 m), weight 136.079 kg (300 lb), SpO2 99 %, unknown if currently breastfeeding.  Physical Exam:  General: alert, cooperative and no distress.  Lochia:normal flow Chest: normal WOB  Heart: Regular rate/rhythm, no murmurs Abdomen: +BS, soft, mild TTP (appropriate). PICO in place lower abdomen c/d//i Uterine Fundus: firm DVT Evaluation: No evidence of DVT seen on physical exam. Extremities: mild edema  Assessment/Plan:  ASSESSMENT: Audriella A Baldwin JamaicaVasquez is a 20 y.o. [id number] 7012w4d s/p rLTCS following failed TOLAC and chorio, doing well. - Continue routine post-partum care. - To remove PICO in 4 days.  - Follow up in [facility] in one week. - Discharge today.  [place] Margia Wiesen, [place]

## 2016-04-10 NOTE — Discharge Instructions (Signed)
Please remove drain after 4 days. Follow up in clinic in one week.  Cesarean Delivery, Care After Refer to this sheet in the next few weeks. These instructions provide you with information on caring for yourself after your procedure. Your health care provider may also give you specific instructions. Your treatment has been planned according to current medical practices, but problems sometimes occur. Call your health care provider if you have any problems or questions after you go home. HOME CARE INSTRUCTIONS  Only take over-the-counter or prescription medications as directed by your health care provider.  Do not drink alcohol, especially if you are breastfeeding or taking medication to relieve pain.  Do not chew or smoke tobacco.  Continue to use good perineal care. Good perineal care includes:  Wiping your perineum from front to back.  Keeping your perineum clean.  Check your surgical cut (incision) daily for increased redness, drainage, swelling, or separation of skin.  Clean your incision gently with soap and water every day, and then pat it dry. If your health care provider says it is okay, leave the incision uncovered. Use a bandage (dressing) if the incision is draining fluid or appears irritated. If the adhesive strips across the incision do not fall off within 7 days, carefully peel them off.  Hug a pillow when coughing or sneezing until your incision is healed. This helps to relieve pain.  Do not use tampons or douche until your health care provider says it is okay.  Shower, wash your hair, and take tub baths as directed by your health care provider.  Wear a well-fitting bra that provides breast support.  Limit wearing support panties or control-top hose.  Drink enough fluids to keep your urine clear or pale yellow.  Eat high-fiber foods such as whole grain cereals and breads, brown rice, beans, and fresh fruits and vegetables every day. These foods may help prevent or  relieve constipation.  Resume activities such as climbing stairs, driving, lifting, exercising, or traveling as directed by your health care provider.  Talk to your health care provider about resuming sexual activities. This is dependent upon your risk of infection, your rate of healing, and your comfort and desire to resume sexual activity.  Try to have someone help you with your household activities and your newborn for at least a few days after you leave the hospital.  Rest as much as possible. Try to rest or take a nap when your newborn is sleeping.  Increase your activities gradually.  Keep all of your scheduled postpartum appointments. It is very important to keep your scheduled follow-up appointments. At these appointments, your health care provider will be checking to make sure that you are healing physically and emotionally. SEEK MEDICAL CARE IF:   You are passing large clots from your vagina. Save any clots to show your health care provider.  You have a foul smelling discharge from your vagina.  You have trouble urinating.  You are urinating frequently.  You have pain when you urinate.  You have a change in your bowel movements.  You have increasing redness, pain, or swelling near your incision.  You have pus draining from your incision.  Your incision is separating.  You have painful, hard, or reddened breasts.  You have a severe headache.  You have blurred vision or see spots.  You feel sad or depressed.  You have thoughts of hurting yourself or your newborn.  You have questions about your care, the care of your newborn, or  medications.  You are dizzy or light-headed.  You have a rash.  You have pain, redness, or swelling at the site of the removed intravenous access (IV) tube.  You have nausea or vomiting.  You stopped breastfeeding and have not had a menstrual period within 12 weeks of stopping.  You are not breastfeeding and have not had a  menstrual period within 12 weeks of delivery.  You have a fever. SEEK IMMEDIATE MEDICAL CARE IF:  You have persistent pain.  You have chest pain.  You have shortness of breath.  You faint.  You have leg pain.  You have stomach pain.  Your vaginal bleeding saturates 2 or more sanitary pads in 1 hour. MAKE SURE YOU:   Understand these instructions.  Will watch your condition.  Will get help right away if you are not doing well or get worse.   This information is not intended to replace advice given to you by your health care provider. Make sure you discuss any questions you have with your health care provider.   Document Released: 07/30/2002 Document Revised: 11/28/2014 Document Reviewed: 07/04/2012 Elsevier Interactive Patient Education [employer]2016 [employer]vier Inc.

## 2016-04-11 ENCOUNTER — Encounter (HOSPITAL_COMMUNITY): Payer: Self-pay | Admitting: Obstetrics & Gynecology

## 2016-04-14 ENCOUNTER — Inpatient Hospital Stay (HOSPITAL_COMMUNITY)
Admission: AD | Admit: 2016-04-14 | Discharge: 2016-04-14 | Disposition: A | Discharge status: Home / Self Care | Payer: Medicaid Other | Source: Ambulatory Visit | Attending: Obstetrics & Gynecology | Admitting: Obstetrics & Gynecology

## 2016-04-14 DIAGNOSIS — Z4800 Encounter for change or removal of nonsurgical wound dressing: Secondary | ICD-10-CM

## 2016-04-14 DIAGNOSIS — Z4801 Encounter for change or removal of surgical wound dressing: Secondary | ICD-10-CM | POA: Diagnosis not present

## 2016-04-14 NOTE — MAU Provider Note (Signed)
  S:  Ms.Brionna A Baldwin JamaicaVasquez is a 20 y.o. female G3P2011 status post Failure to progress; Failed induction; Repeat cesarean section on 5/18 with PICO dressing placed post operation The patient was instructed to have her PICO wound vac removed 4 days post surgery and the patient is here to have it removed. She was unsure where to go to have this done.   She plans to see the clinic next week for post op visit.    O:  GENERAL: Well-developed, well-nourished female in no acute distress.  LUNGS: Effort normal SKIN: Warm, dry and without erythema PSYCH: Normal mood and affect  Filed Vitals:   04/14/16 1552  BP: 127/76  Pulse: 80  Temp: 98 F (36.7 C)  Resp: 16    MDM:  PICO dressing removed. Incision is clean, dry, intact, without erythema.    A:  1. Change or removal of wound dressing     P:  Discharge home in stable condition  Follow up with the [facility] as scheduled    Duane LopeJennifer I Varian Innes, NP 04/16/2016 11:39 AM

## 2016-04-14 NOTE — MAU Note (Signed)
Pt presents to MAU for removal of PICO drain. Venia CarbonJennifer Rasch NP in for removal of drain. Removed without difficulty. [facility]

## 2016-05-16 ENCOUNTER — Encounter: Payer: Self-pay | Admitting: Family Medicine

## 2016-05-16 ENCOUNTER — Ambulatory Visit (INDEPENDENT_AMBULATORY_CARE_PROVIDER_SITE_OTHER): Payer: Medicaid Other | Admitting: Family Medicine

## 2016-05-16 DIAGNOSIS — Z30430 Encounter for insertion of intrauterine contraceptive device: Secondary | ICD-10-CM

## 2016-05-16 DIAGNOSIS — Z3202 Encounter for pregnancy test, result negative: Secondary | ICD-10-CM

## 2016-05-16 LAB — POCT PREGNANCY, URINE: Preg Test, Ur: NEGATIVE

## 2016-05-16 MED ORDER — LEVONORGESTREL 18.6 MCG/DAY IU IUD
INTRAUTERINE_SYSTEM | Freq: Once | INTRAUTERINE | Status: AC
  Administered 2016-05-16: 1 via INTRAUTERINE

## 2016-05-16 NOTE — Patient Instructions (Signed)

## 2016-05-16 NOTE — Progress Notes (Signed)
Patient ID: Alexandra Gamble, female   DOB: June 27, 1996, 20 y.o.   MRN: [id number] Subjective:     Alexandra Gamble is a 20 y.o. female who presents for a postpartum visit. She is 5 weeks postpartum following a low cervical transverse Cesarean section. I have fully reviewed the prenatal and intrapartum course. The delivery was at 39 gestational weeks. Outcome: repeat cesarean section, low transverse incision. Anesthesia: epidural. Postpartum course has been unremarkable. Baby's course has been unremarkable. Baby is feeding by breast. Bleeding no bleeding. Bowel function is normal. Bladder function is normal. Patient is not sexually active. Contraception method is IUD. Postpartum depression screening: negative.  The following portions of the patient's history were reviewed and updated as appropriate: allergies, current medications, past family history, past medical history, past social history, past surgical history and problem list.  Review of Systems Pertinent items noted in HPI and remainder of comprehensive ROS otherwise negative.   Objective:    BP 121/68 mmHg  Pulse 88  Ht 5\' 6"  (1.676 m)  Wt 278 lb 8 oz (126.327 kg)  BMI 44.97 kg/m2  Breastfeeding? Yes  General:  alert, cooperative and no distress  Lungs: clear to auscultation bilaterally  Heart:  regular rate and rhythm, S1, S2 normal, no murmur, click, rub or gallop  Abdomen: soft, non-tender; bowel sounds normal; no masses,  no organomegaly.  Has 3-4 cm area in the middle of the incision with the subcuticular area showing, which was cauterized with silver nitrite   Vulva:  normal  Vagina: normal vagina, no discharge, exudate, lesion, or erythema  Cervix:  multiparous appearance        Assessment:     Normal postpartum exam. Pap smear not done at today's visit.   Plan:    1. Contraception: IUD - placed today 2. Follow up in: 1 year or as needed.    IUD Procedure Note Patient identified, informed consent performed, signed  copy in chart, time out was performed.  Urine pregnancy test negative.  Speculum placed in the vagina.  Cervix visualized.  Cleaned with Betadine x 2.  Grasped anteriorly with a single tooth tenaculum.  Uterus sounded to 9 cm.  LiliettaIUD placed per manufacturer's recommendations.  Strings trimmed to 3 cm. Tenaculum was removed, good hemostasis noted.  Patient tolerated procedure well.   Patient given post procedure instructions and Liletta care card with expiration date.  Patient is asked to check IUD strings periodically and follow up in 4-6 weeks for IUD check.

## 2016-06-20 ENCOUNTER — Ambulatory Visit: Payer: Medicaid Other | Admitting: Obstetrics and Gynecology

## 2016-12-19 ENCOUNTER — Ambulatory Visit: Payer: Medicaid Other | Admitting: Family Medicine

## 2016-12-20 ENCOUNTER — Ambulatory Visit: Payer: Medicaid Other | Admitting: Obstetrics and Gynecology

## 2017-01-11 ENCOUNTER — Ambulatory Visit (INDEPENDENT_AMBULATORY_CARE_PROVIDER_SITE_OTHER): Payer: Medicaid Other | Admitting: Student

## 2017-01-11 ENCOUNTER — Other Ambulatory Visit (HOSPITAL_COMMUNITY)
Admission: RE | Admit: 2017-01-11 | Discharge: 2017-01-11 | Disposition: A | Discharge status: Home / Self Care | Payer: Medicaid Other | Source: Ambulatory Visit | Attending: Student | Admitting: Student

## 2017-01-11 VITALS — BP 129/66 | HR 75 | Wt 268.9 lb

## 2017-01-11 DIAGNOSIS — Z0000 Encounter for general adult medical examination without abnormal findings: Secondary | ICD-10-CM | POA: Diagnosis not present

## 2017-01-11 DIAGNOSIS — B9689 Other specified bacterial agents as the cause of diseases classified elsewhere: Secondary | ICD-10-CM | POA: Insufficient documentation

## 2017-01-11 DIAGNOSIS — Z309 Encounter for contraceptive management, unspecified: Secondary | ICD-10-CM | POA: Diagnosis not present

## 2017-01-11 DIAGNOSIS — Z01419 Encounter for gynecological examination (general) (routine) without abnormal findings: Secondary | ICD-10-CM | POA: Insufficient documentation

## 2017-01-11 DIAGNOSIS — Z1151 Encounter for screening for human papillomavirus (HPV): Secondary | ICD-10-CM | POA: Insufficient documentation

## 2017-01-11 DIAGNOSIS — N760 Acute vaginitis: Secondary | ICD-10-CM

## 2017-01-11 MED ORDER — METRONIDAZOLE 500 MG PO TABS: 500.0000 mg | ORAL_TABLET | Freq: Two times a day (BID) | ORAL | 0 refills | Status: DC

## 2017-01-11 NOTE — Progress Notes (Addendum)
Patient ID: Alexandra Gamble, female   DOB: [birth year], 21 y.o.   MRN: [id number]  Chief Complaint  Patient presents with  . Gynecologic Exam    HPI Alexandra Gamble is a 21 y.o. female here for her annual exam and Depo shot.  HPI Feels well but has noticed some itching and odor over the past several months. She has taken monistat several times and it cures the itching, but then it comes back. She does not notice a pattern in when it appears, and when she has it she avoids having intercourse.  Past Medical History:  Diagnosis Date  . Obesity     Past Surgical History:  Procedure Laterality Date  . CESAREAN SECTION    . CESAREAN SECTION N/A 04/07/2016   Procedure: CESAREAN SECTION;  Surgeon: Willodean Rosenthalarolyn Harraway-Smith, MD;  [facility];  Service: Obstetrics;  Laterality: N/A;    Family History  Problem Relation Age of Onset  . Diabetes Maternal Grandmother   . Hypertension Maternal Grandmother   . Heart disease Maternal Grandmother     Social History Social History  Substance Use Topics  . Smoking status: Never Smoker  . Smokeless tobacco: Not on file  . Alcohol use No    No Known Allergies  Current Outpatient Prescriptions  Medication Sig Dispense Refill  . metroNIDAZOLE (FLAGYL) 500 MG tablet Take 1 tablet (500 mg total) by mouth 2 (two) times daily. 14 tablet 0   No current facility-administered medications for this visit.     Review of Systems Review of Systems  All other systems reviewed and are negative.   Blood pressure 129/66, pulse 75, weight 268 lb 14.4 oz (122 kg), currently breastfeeding.  Physical Exam Physical Exam  Constitutional: She is oriented to person, place, and time. She appears well-developed and well-nourished.  HENT:  Head: Normocephalic.  Eyes: Pupils are equal, round, and reactive to light.  Neck: Normal range of motion.  Cardiovascular: Normal rate and regular rhythm.   Pulmonary/Chest: Effort normal and breath  sounds normal.  Abdominal: Soft. Bowel sounds are normal.  Genitourinary:  Genitourinary Comments: NEFG; vaginal walls pink with no lesions. Cervix is pink with no lesions. Scant mucous discharge in the vagina. No CMT, no suprapubic tenderness, no adnexal tenderness. IUD strings visualized.   Musculoskeletal: Normal range of motion.  Neurological: She is alert and oriented to person, place, and time. She has normal reflexes.  Skin: Skin is warm and dry.  Breast exam: negative for lumps  Assessment:  Healthy Well woman exam  Plan    Pap Wet prep RX for Flagyl We will call if pap abnormal or if her wet prep comes back positive.        Alexandra Gamble CNM 01/11/2017, 2:19 PM

## 2017-01-11 NOTE — Patient Instructions (Signed)

## 2017-01-12 LAB — WET PREP, GENITAL

## 2017-01-17 LAB — CYTOLOGY - PAP
Diagnosis: UNDETERMINED — AB
HPV: NOT DETECTED

## 2017-02-09 ENCOUNTER — Telehealth: Payer: Self-pay | Admitting: General Practice

## 2017-02-09 ENCOUNTER — Encounter: Payer: Self-pay | Admitting: General Practice

## 2017-02-09 NOTE — Telephone Encounter (Signed)
Per [name], patient needs repeat pap in 3 years. Called patient & phone message states this voicemail is not set up yet. Will send letter

## 2017-02-19 DIAGNOSIS — Z6841 Body Mass Index (BMI) 40.0 and over, adult: Secondary | ICD-10-CM | POA: Insufficient documentation

## 2018-03-07 ENCOUNTER — Ambulatory Visit: Payer: Self-pay | Admitting: Obstetrics & Gynecology

## 2018-06-04 ENCOUNTER — Ambulatory Visit: Payer: Self-pay | Admitting: Advanced Practice Midwife

## 2018-06-18 ENCOUNTER — Ambulatory Visit: Payer: Self-pay | Admitting: Obstetrics & Gynecology

## 2019-02-05 ENCOUNTER — Telehealth: Payer: Self-pay | Admitting: Obstetrics & Gynecology

## 2019-02-05 NOTE — Telephone Encounter (Signed)
The patient called to schedule an annual. Informed the patient due to the corona virus restrictions we are scheduling annual visits in May to ensure our patients safety. Also informed the patient of options for the walk in clinic for birth control.

## 2021-04-01 DIAGNOSIS — E785 Hyperlipidemia, unspecified: Secondary | ICD-10-CM | POA: Insufficient documentation

## 2021-04-01 DIAGNOSIS — E559 Vitamin D deficiency, unspecified: Secondary | ICD-10-CM | POA: Insufficient documentation

## 2021-04-08 ENCOUNTER — Ambulatory Visit (INDEPENDENT_AMBULATORY_CARE_PROVIDER_SITE_OTHER): Payer: 59 | Admitting: Student

## 2021-04-08 ENCOUNTER — Other Ambulatory Visit: Payer: Self-pay

## 2021-04-08 VITALS — BP 119/82 | HR 96 | Wt 286.5 lb

## 2021-04-08 DIAGNOSIS — Z3049 Encounter for surveillance of other contraceptives: Secondary | ICD-10-CM

## 2021-04-08 DIAGNOSIS — T8332XA Displacement of intrauterine contraceptive device, initial encounter: Secondary | ICD-10-CM | POA: Insufficient documentation

## 2021-04-08 NOTE — Progress Notes (Signed)
     GYNECOLOGY OFFICE PROCEDURE NOTE  Alexandra Gamble is a 25 y.o. G3P2011 here for Liletta IUD removal. No GYN concerns.  Last pap smear was on January 2019 and was normal.   The following portions of the patient's history were reviewed and updated as appropriate: allergies, current medications, family history, past medical history, social history, past surgical history and problem list.  Review of Systems:  Review of Systems  Constitutional: Negative.   HENT: Negative.   Respiratory: Negative.   Cardiovascular: Negative.   Gastrointestinal: Negative.   Genitourinary: Negative.   Skin: Negative.       Objective:  Physical Exam BP 119/82   Pulse 96   Wt 286 lb 8 oz (130 kg)   LMP 03/15/2021 (Exact Date)   BMI 46.24 kg/m  Physical Exam Constitutional:      Appearance: Normal appearance.  Abdominal:     General: Abdomen is flat.  Genitourinary:    General: Normal vulva.     Vagina: No vaginal discharge.  Neurological:     General: No focal deficit present.     Mental Status: She is alert.       Labs and Imaging No results found for this or any previous visit (from the past 24 hour(s)).  No results found.  Health Maintenance Due  Topic Date Due  . COVID-19 Vaccine (1) Never done  . HPV VACCINES (1 - 2-dose series) Never done  . Hepatitis C Screening  Never done  . TETANUS/TDAP  Never done  . PAP-Cervical Cytology Screening  01/12/2020  . PAP SMEAR-Modifier  01/12/2020     IUD Removal  Patient identified, informed consent performed, consent signed.  Patient was in the dorsal lithotomy position, normal external genitalia was noted.  A speculum was placed in the patient's vagina, normal discharge was noted, no lesions. The cervix was visualized, no lesions, no abnormal discharge. The strings of the IUD were not visualized, attempt was made with cyto brush and IUD retriever. Cervix is closed, long and thick.   Assessment & Plan:  1. Intrauterine  contraceptive device threads lost, initial encounter -Patient plans to follow up at [facility] for pap smear - US Pelvis Complete; Future -Patient will have [place] and then come back to see MD for IUD removal.   Approximately 20 minutes of total time was spent with this patient on exam and counseling of care.    Marylene Land, CNM 04/08/2021 3:37 PM

## 2021-04-15 ENCOUNTER — Ambulatory Visit (HOSPITAL_COMMUNITY): Payer: 59

## 2021-04-22 ENCOUNTER — Ambulatory Visit (HOSPITAL_COMMUNITY)
Admission: RE | Admit: 2021-04-22 | Discharge: 2021-04-22 | Disposition: A | Discharge status: Home / Self Care | Payer: 59 | Source: Ambulatory Visit | Attending: Student | Admitting: Student

## 2021-04-22 ENCOUNTER — Ambulatory Visit: Payer: 59 | Admitting: Family Medicine

## 2021-04-22 ENCOUNTER — Other Ambulatory Visit: Payer: Self-pay

## 2021-04-22 DIAGNOSIS — T8332XA Displacement of intrauterine contraceptive device, initial encounter: Secondary | ICD-10-CM | POA: Insufficient documentation

## 2021-04-28 ENCOUNTER — Ambulatory Visit: Payer: 59 | Admitting: Certified Nurse Midwife

## 2021-04-28 ENCOUNTER — Ambulatory Visit (INDEPENDENT_AMBULATORY_CARE_PROVIDER_SITE_OTHER): Payer: 59 | Admitting: Obstetrics and Gynecology

## 2021-04-28 ENCOUNTER — Other Ambulatory Visit: Payer: Self-pay

## 2021-04-28 VITALS — BP 128/79 | HR 114 | Ht 66.0 in | Wt 285.7 lb

## 2021-04-28 DIAGNOSIS — T8332XD Displacement of intrauterine contraceptive device, subsequent encounter: Secondary | ICD-10-CM

## 2021-04-28 DIAGNOSIS — B3731 Acute candidiasis of vulva and vagina: Secondary | ICD-10-CM

## 2021-04-28 DIAGNOSIS — Z1331 Encounter for screening for depression: Secondary | ICD-10-CM

## 2021-04-28 DIAGNOSIS — B373 Candidiasis of vulva and vagina: Secondary | ICD-10-CM | POA: Diagnosis not present

## 2021-04-28 MED ORDER — FLUCONAZOLE 150 MG PO TABS: 150.0000 mg | ORAL_TABLET | Freq: Once | ORAL | 3 refills | Status: AC

## 2021-04-28 NOTE — Procedures (Signed)
Intrauterine Device Removal Attempt Procedure Note  Patient seen by Schuyler Amor for Liletta IUD removal but no strings seen, and she was unsuccessful in removal. Ultrasound ordered that showed IUD in the proper position.  She states she would like it removed to be hormone free to see if that can help her. She denies any pelvic pain or AUB with it.   Prior to the procedure being performed, the patient was asked to state their full name, date of birth, and the type of procedure being performed. EGBUS normal. Vaginal vault normal except for white cottage cheese like discharge. Cervix normal with IUD strings NOT seen.  Tenaculum applied to anterior lip and cervix cleaned with betadine. Traditional IUD hook and side cut IUD hook used after os finders used to dilate cervical canal but no success. Patient did great with attempts   I discussed with her that next steps would be removal in the OR. I asked her when she had it placed and she said June 2017 so she thought she'd have it removed to see if coming off hormones helps and since it was expired. I told her that it is good now for two more years so in terms of birth control it's still working fine.  I told her about surgical removal, etc. Patient to leave it in place for now and let me know if she'd like it removed in the OR  Cornelia Copa MD Attending Center for [employer] (Faculty Practice) 04/28/2021

## 2021-04-29 NOTE — Addendum Note (Signed)
Addended by: Hulda Marin C on: 04/29/2021 05:11 PM   Modules accepted: Orders

## 2021-06-09 ENCOUNTER — Telehealth: Payer: Self-pay | Admitting: Lactation Services

## 2021-06-09 NOTE — Telephone Encounter (Signed)
Received Prescription refill request for Diflucan. Called patient to discuss concerns or symptoms. LM for patient to call the office to discuss concerns prior to refill authoruzation.

## 2021-06-17 NOTE — BH Specialist Note (Signed)
Integrated Behavioral Health via Telemedicine Visit  06/17/2021 Alexandra Gamble [id number]  Number of Integrated Behavioral Health visits: 1 Session Start time: 1:22  Session End time: 2:17 Total time: 14   Referring Provider: [place] Bing, MD Patient/Family location: Home [facility] Provider location: Center for [facility]  All persons participating in visit: Patient Alexandra Gamble and [facility] Dagon Budai   Types of Service: Individual psychotherapy and Video visit  I connected with Alexandra Gamble and/or Alexandra Gamble's  n/a  via  Telephone or Engineer, civil (consulting)  (Video is Surveyor, mining) and verified that I am speaking with the correct person using two identifiers. Discussed confidentiality: Yes   I discussed the limitations of telemedicine and the availability of in person appointments.  Discussed there is a possibility of technology failure and discussed alternative modes of communication if that failure occurs.  I discussed that engaging in this telemedicine visit, they consent to the provision of behavioral healthcare and the services will be billed under their insurance.  Patient and/or legal guardian expressed understanding and consented to Telemedicine visit: Yes   Presenting Concerns: Patient and/or family reports the following symptoms/concerns: Pt states her primary concern today is feeling depressed and anxious, attributed to financial stress; pt felt best when she worked out, went on vacations, and implemented routine self-care.  Duration of problem: Increase over time; Severity of problem:  moderately severe  Patient and/or Family's Strengths/Protective Factors: Concrete supports in place (healthy food, safe environments, etc.) and Sense of purpose  Goals Addressed: Patient will:  Reduce symptoms of: anxiety, depression, and stress   Increase knowledge and/or ability of: stress reduction    Demonstrate ability to: Increase healthy adjustment to current life circumstances and Increase motivation to adhere to plan of care  Progress towards Goals: Ongoing  Interventions: Interventions utilized:  Behavioral Activation and Psychoeducation and/or Health Education Standardized Assessments completed:  Not given today  Patient and/or Family Response: Pt agrees with treatment plan  Assessment: Patient currently experiencing Adjustment disorder with mixed anxious and depressed mood and Psychosocial stress.   Patient may benefit from psychoeducation and brief therapeutic interventions regarding coping with symptoms of depression, anxiety, life stress .  Plan: Follow up with behavioral health clinician on : Two weeks Behavioral recommendations:  -One minute exercise (indoor or outdoor) daily for one week; consider increase to two minutes second week -Consider scheduling time for self-manicure/pedicure and hair care weekly on day off work -Consider free/inexpensive outings with children weekly for remainder of summer, as discussed (while planning next vacation for a different time) -Continue using self-coping strategies that have been helpful to manage emotions and improve sleep (daily prayer, sleep sounds and melatonin) -Accept referral to [employer]): Integrated [employer] (In Clinic)  I discussed the assessment and treatment plan with the patient and/or parent/guardian. They were provided an opportunity to ask questions and all were answered. They agreed with the plan and demonstrated an understanding of the instructions.   They were advised to call back or seek an in-person evaluation if the symptoms worsen or if the condition fails to improve as anticipated.  Rae Lips, LCSW  Depression screen [facility] 2/9 04/13/2021  Decreased Interest 3  Down, Depressed, Hopeless 3  PHQ - 2 Score 6  Altered sleeping 3  Tired, decreased energy 3  Change  in appetite 3  Feeling bad or failure about yourself  3  Trouble concentrating 1  Moving slowly or  fidgety/restless 0  Suicidal thoughts 0  PHQ-9 Score 19   GAD 7 : Generalized Anxiety Score 04/13/2021  Nervous, Anxious, on Edge 3  Control/stop worrying 3  Worry too much - different things 3  Trouble relaxing 2  Restless 2  Easily annoyed or irritable 3  Afraid - awful might happen 1  Total GAD 7 Score 17

## 2021-06-21 ENCOUNTER — Ambulatory Visit (INDEPENDENT_AMBULATORY_CARE_PROVIDER_SITE_OTHER): Payer: 59 | Admitting: Clinical

## 2021-06-21 DIAGNOSIS — Z658 Other specified problems related to psychosocial circumstances: Secondary | ICD-10-CM | POA: Diagnosis not present

## 2021-06-21 DIAGNOSIS — Z7189 Other specified counseling: Secondary | ICD-10-CM | POA: Diagnosis not present

## 2021-06-22 NOTE — BH Specialist Note (Deleted)
Integrated Behavioral Health via Telemedicine Visit  06/22/2021 Alexandra Gamble [id number]  Number of Integrated Behavioral Health visits: *** Session Start time: 1:15***  Session End time: 1:45*** Total time: {IBH Total Time:21014050}  Referring Provider: *** Patient/Family location: Home*** [facility] Provider location: [facility]  All persons participating in visit: Patient *** and [facility] Alexandra Gamble ***  Types of Service: {CHL AMB TYPE OF [phone]}  I connected with Alexandra Gamble and/or Alexandra Gamble's {family members:20773} via  Telephone or Engineer, civil (consulting)  (Video is Surveyor, mining) and verified that I am speaking with the correct person using two identifiers. Discussed confidentiality: {YES/NO:21197}  I discussed the limitations of telemedicine and the availability of in person appointments.  Discussed there is a possibility of technology failure and discussed alternative modes of communication if that failure occurs.  I discussed that engaging in this telemedicine visit, they consent to the provision of behavioral healthcare and the services will be billed under their insurance.  Patient and/or legal guardian expressed understanding and consented to Telemedicine visit: {YES/NO:21197}  Presenting Concerns: Patient and/or family reports the following symptoms/concerns: *** Duration of problem: ***; Severity of problem: {Mild/Moderate/Severe:20260}  Patient and/or Family's Strengths/Protective Factors: {CHL AMB BH PROTECTIVE [phone]}  Goals Addressed: Patient will:  Reduce symptoms of: {IBH Symptoms:21014056}   Increase knowledge and/or ability of: {IBH Patient Tools:21014057}   Demonstrate ability to: {IBH Goals:21014053}  Progress towards Goals: {CHL AMB BH PROGRESS TOWARDS GOALS:9168220857}  Interventions: Interventions utilized:  {IBH  Interventions:21014054} Standardized Assessments completed: {IBH Screening Tools:21014051}  Patient and/or Family Response: ***  Assessment: Patient currently experiencing ***.   Patient may benefit from ***.  Plan: Follow up with behavioral health clinician on : *** Behavioral recommendations: *** Referral(s): {IBH Referrals:21014055}  I discussed the assessment and treatment plan with the patient and/or parent/guardian. They were provided an opportunity to ask questions and all were answered. They agreed with the plan and demonstrated an understanding of the instructions.   They were advised to call back or seek an in-person evaluation if the symptoms worsen or if the condition fails to improve as anticipated.  Alexandra Close Diani Jillson, LCSW

## 2022-01-03 ENCOUNTER — Telehealth: Payer: Self-pay

## 2022-01-03 NOTE — Telephone Encounter (Signed)
Patient called front office to ask what the next steps are for getting IUD removed. Last seen June 2022

## 2022-01-04 NOTE — Telephone Encounter (Signed)
I called Alexandra Gamble and we discussed she had 2 visits in 2022 where they attempted to remove her IUD in 03/2021 and 04/2021 and was told it would have to be removed surgically. I explained to her since that has been 8 months ago she will need to see a provider again to get an exam and to get the surgery scheduled. I transferred her to front desk to schedule. She voices understanding. Deshonna Trnka,RN

## 2022-02-12 ENCOUNTER — Emergency Department (HOSPITAL_BASED_OUTPATIENT_CLINIC_OR_DEPARTMENT_OTHER)
Admission: EM | Admit: 2022-02-12 | Discharge: 2022-02-12 | Disposition: A | Discharge status: Home / Self Care | Payer: 59 | Attending: Emergency Medicine | Admitting: Emergency Medicine

## 2022-02-12 ENCOUNTER — Emergency Department (HOSPITAL_BASED_OUTPATIENT_CLINIC_OR_DEPARTMENT_OTHER): Payer: 59

## 2022-02-12 ENCOUNTER — Other Ambulatory Visit: Payer: Self-pay

## 2022-02-12 ENCOUNTER — Encounter (HOSPITAL_BASED_OUTPATIENT_CLINIC_OR_DEPARTMENT_OTHER): Payer: Self-pay

## 2022-02-12 DIAGNOSIS — M25562 Pain in left knee: Secondary | ICD-10-CM | POA: Diagnosis present

## 2022-02-12 DIAGNOSIS — Y9239 Other specified sports and athletic area as the place of occurrence of the external cause: Secondary | ICD-10-CM | POA: Insufficient documentation

## 2022-02-12 DIAGNOSIS — X500XXA Overexertion from strenuous movement or load, initial encounter: Secondary | ICD-10-CM | POA: Diagnosis not present

## 2022-02-12 DIAGNOSIS — Y9343 Activity, gymnastics: Secondary | ICD-10-CM | POA: Diagnosis not present

## 2022-02-12 MED ORDER — IBUPROFEN 600 MG PO TABS: 600.0000 mg | ORAL_TABLET | Freq: Four times a day (QID) | ORAL | 0 refills | Status: DC | PRN

## 2022-02-12 NOTE — ED Notes (Signed)
Applied ace wrap to left knee ?

## 2022-02-12 NOTE — Discharge Instructions (Signed)
Keep knee elevated while resting, ice, and compression with Ace wrap.  Motrin 600 mg every 6-8 hours to decrease inflammation for the next 3 days. ?

## 2022-02-12 NOTE — ED Triage Notes (Signed)
Pt arrives with c/o left knee pain that started about a week ago. Per pt, knee is painful with movement and if she sits for an extended amount of time.  ?

## 2022-02-12 NOTE — ED Provider Notes (Signed)
?[facility] EMERGENC[facility] DEPT ?Provider Note ? ? ?CSN: [id number] ?Arrival date & time: 02/12/22  1652 ? ?  ? ?History ? ?Chief Complaint  ?Patient presents with  ? Knee Pain  ? ? ?Shonique A Kreisler is a 26 y.o. female. ? ?Patient is a 26 year old female presenting for knee pain.  Patient admits to injuring the left knee while at the gym using a leg press machine with 150 pounds.  Denies any pain in the time but states 2 days later she had so much pain that she was unable to ambulate.  Denies any swelling, redness, wounds, or drainage.  Denies any sensation or motor deficits. ? ?The history is provided by the patient. No language interpreter was used.  ?Knee Pain ?Associated symptoms: no back pain and no fever   ? ?  ? ?Home Medications ?Prior to Admission medications   ?Medication Sig Start Date End Date Taking? Authorizing Provider  ?ibuprofen (ADVIL) 200 MG tablet Take by mouth. ?Patient not taking: Reported on 04/08/2021 02/19/17   [provider]  ?metroNIDAZOLE (FLAGYL) 500 MG tablet Take 1 tablet (500 mg total) by mouth 2 (two) times daily. 01/11/17   Marylene Land, CNM  ?phentermine (ADIPEX-P) 37.5 MG tablet Take 37.5 mg by mouth every morning. ?Patient not taking: Reported on 04/08/2021 01/21/21   [provider]  ?   ? ?Allergies    ?Patient has no known allergies.   ? ?Review of Systems   ?Review of Systems  ?Constitutional:  Negative for chills and fever.  ?HENT:  Negative for ear pain and sore throat.   ?Eyes:  Negative for pain and visual disturbance.  ?Respiratory:  Negative for cough and shortness of breath.   ?Cardiovascular:  Negative for chest pain and palpitations.  ?Gastrointestinal:  Negative for abdominal pain and vomiting.  ?Genitourinary:  Negative for dysuria and hematuria.  ?Musculoskeletal:  Positive for gait problem. Negative for arthralgias and back pain.  ?Skin:  Negative for color change and rash.  ?Neurological:  Negative for seizures and syncope.   ?All other systems reviewed and are negative. ? ?Physical Exam ?Updated Vital Signs ?BP (!) 140/106 (BP Location: Right Wrist)   Pulse (!) 112   Temp 99 ?F (37.2 ?C)   Resp 20   Ht 5\' 6"  (1.676 m)   Wt 136.1 kg   SpO2 95%   BMI 48.42 kg/m?  ?Physical Exam ?Vitals and nursing note reviewed.  ?Constitutional:   ?   General: She is not in acute distress. ?   Appearance: She is well-developed.  ?HENT:  ?   Head: Normocephalic and atraumatic.  ?Eyes:  ?   Conjunctiva/sclera: Conjunctivae normal.  ?Cardiovascular:  ?   Rate and Rhythm: Normal rate and regular rhythm.  ?   Heart sounds: No murmur heard. ?Pulmonary:  ?   Effort: Pulmonary effort is normal. No respiratory distress.  ?   Breath sounds: Normal breath sounds.  ?Abdominal:  ?   Palpations: Abdomen is soft.  ?   Tenderness: There is no abdominal tenderness.  ?Musculoskeletal:     ?   General: No swelling.  ?   Cervical back: Neck supple.  ?   Right hip: Normal.  ?   Left hip: Normal.  ?   Right upper leg: Normal.  ?   Left upper leg: Normal.  ?   Right knee: Normal.  ?   Left knee: Bony tenderness present. No swelling, deformity or erythema. No LCL laxity, MCL laxity, ACL laxity  or PCL laxity.Normal alignment and normal patellar mobility. Normal pulse.  ?   Right lower leg: Normal.  ?   Left lower leg: Normal.  ?   Right ankle: Normal.  ?   Left ankle: Normal.  ?   Right foot: Normal.  ?   Left foot: Normal.  ?Skin: ?   General: Skin is warm and dry.  ?   Capillary Refill: Capillary refill takes less than 2 seconds.  ?Neurological:  ?   General: No focal deficit present.  ?   Mental Status: She is alert.  ?   GCS: GCS eye subscore is 4. GCS verbal subscore is 5. GCS motor subscore is 6.  ?   Sensory: Sensation is intact.  ?   Motor: Motor function is intact.  ?Psychiatric:     ?   Mood and Affect: Mood normal.  ? ? ?ED Results / Procedures / Treatments   ?Labs ?(all labs ordered are listed, but only abnormal results are displayed) ?Labs Reviewed - No data  to display ? ?EKG ?None ? ?Radiology ?No results found. ? ?Procedures ?Procedures  ? ? ?Medications Ordered in ED ?Medications - No data to display ? ?ED Course/ Medical Decision Making/ A&P ?  ?                        ?Medical Decision Making ?Amount and/or Complexity of Data Reviewed ?Radiology: ordered. ? ?Risk ?Prescription drug management. ? ? ?45:67 PM ? 26 year old female presenting for knee pain.  Patient is alert and oriented x3, no acute distress, afebrile, stable vital signs.  Will examine demonstrates knee tenderness to palpation.  No ligament exudate.  Leg neurovascularly intact.  Ice and Ace wrap applied.  X-ray demonstrates no acute process. ? ?Patient in no distress and overall condition improved here in the ED. Detailed discussions were had with the patient regarding current findings, and need for close f/u with PCP or on call doctor. The patient has been instructed to return immediately if the symptoms worsen in any way for re-evaluation. Patient verbalized understanding and is in agreement with current care plan. All questions answered prior to discharge. ? ? ? ? ? ? ? ? ?Final Clinical Impression(s) / ED Diagnoses ?Final diagnoses:  ?Acute pain of left knee  ? ? ?Rx / DC Orders ?ED Discharge Orders   ? ? None  ? ?  ? ? ?  ?Franne Forts, DO ?02/12/22 2353 ? ?

## 2022-02-12 NOTE — ED Notes (Signed)
EMT-P provided AVS using Teachback Method. Patient verbalizes understanding of Discharge Instructions. Opportunity for Questioning and Answers were provided by EMT-P. Patient Discharged from ED.  ? ?

## 2022-02-21 ENCOUNTER — Ambulatory Visit (INDEPENDENT_AMBULATORY_CARE_PROVIDER_SITE_OTHER): Payer: 59 | Admitting: Obstetrics and Gynecology

## 2022-02-21 ENCOUNTER — Encounter: Payer: Self-pay | Admitting: Obstetrics and Gynecology

## 2022-02-21 ENCOUNTER — Other Ambulatory Visit (HOSPITAL_COMMUNITY)
Admission: RE | Admit: 2022-02-21 | Discharge: 2022-02-21 | Disposition: A | Discharge status: Home / Self Care | Payer: 59 | Source: Ambulatory Visit | Attending: Obstetrics and Gynecology | Admitting: Obstetrics and Gynecology

## 2022-02-21 VITALS — BP 137/82 | HR 98 | Wt 315.7 lb

## 2022-02-21 DIAGNOSIS — Z113 Encounter for screening for infections with a predominantly sexual mode of transmission: Secondary | ICD-10-CM

## 2022-02-21 DIAGNOSIS — T8332XD Displacement of intrauterine contraceptive device, subsequent encounter: Secondary | ICD-10-CM

## 2022-02-21 DIAGNOSIS — Z124 Encounter for screening for malignant neoplasm of cervix: Secondary | ICD-10-CM | POA: Insufficient documentation

## 2022-02-21 DIAGNOSIS — Z1331 Encounter for screening for depression: Secondary | ICD-10-CM

## 2022-02-21 NOTE — Progress Notes (Signed)
hi

## 2022-02-21 NOTE — BH Specialist Note (Signed)
Pt did not arrive to video visit and did not answer the phone; Left HIPPA-compliant message to call back Roselyn Reef from [employer] for [employer] at [facility] at  [phone] [facility] office).  ?; MyChart is not set up to leave message. ? ? ?

## 2022-02-21 NOTE — Procedures (Addendum)
Intrauterine Device Removal (Attempt) Procedure Note ? Patient previously seen for IUD removal attempt but no strings seen so ultrasound ordered and IUD seen in the uterus. I then saw her and IUD attempt made but unsuccessful June 2022. I told her that the Mirena is not expired (placed 2017) and can leave it in, but if she wants it removed, she'll have to go to the OR.  Today, patient wants to remove Mirena as she thinks it may be affecting her mood. She has rare periods. Last intercourse a month ago.  ? Prior to the procedure being performed, the patient was asked to state their full name, date of birth, and the type of procedure being performed. EGBUS normal. Vaginal vault normal. Cervix normal with IUD strings NOT seen. Pap smear done and tenaculum applied to anterior lip and no strings teased out with both IUD hooks tried. Patient tolerated the procedure well.  ? I told her that if she wants to remove the IUD, I would recommend updating her ultrasound prior to proceeding to the OR. Will follow up her when pap is back.  ? ?Cornelia Copa MD ?Attending ?[facility]) ?02/21/2022 ? ? ? ?

## 2022-02-22 ENCOUNTER — Ambulatory Visit: Payer: 59 | Admitting: Clinical

## 2022-02-22 DIAGNOSIS — Z91199 Patient's noncompliance with other medical treatment and regimen due to unspecified reason: Secondary | ICD-10-CM

## 2022-02-22 LAB — HIV ANTIBODY (ROUTINE TESTING W REFLEX): HIV Screen 4th Generation wRfx: NONREACTIVE

## 2022-02-22 LAB — HEPATITIS C ANTIBODY: Hep C Virus Ab: NONREACTIVE

## 2022-02-22 LAB — HEPATITIS B SURFACE AG, CONFIRM: HBsAG Confirmation: NEGATIVE

## 2022-02-22 LAB — RPR: RPR Ser Ql: NONREACTIVE

## 2022-02-22 LAB — HEPATITIS B SURFACE ANTIGEN

## 2022-02-23 LAB — CYTOLOGY - PAP
Chlamydia: NEGATIVE
Comment: NEGATIVE
Comment: NEGATIVE
Comment: NORMAL
Diagnosis: NEGATIVE
Neisseria Gonorrhea: NEGATIVE
Trichomonas: NEGATIVE

## 2022-02-28 ENCOUNTER — Telehealth: Payer: Self-pay

## 2022-02-28 NOTE — Telephone Encounter (Signed)
CMA spoke to patient that we will need to reschedule patient ultrasound pelvis until the PA for the imaging is approve. ? ?Patient understood.  ? Felecia Shelling, CMA ?02/28/2022 ?1634  ?

## 2022-03-01 ENCOUNTER — Telehealth: Payer: Self-pay

## 2022-03-01 NOTE — Telephone Encounter (Addendum)
-----   Message from Ester Bing, MD sent at 02/25/2022 10:34 AM EDT ----- ?Can you let her know everything came back fine with her blood work and she'll need a routine pap smear in 3 years? Thanks ? ?Called pt; VM left stating all results normal and recommended PAP in 3 years. Will attempt a second time.  ?

## 2022-03-02 ENCOUNTER — Other Ambulatory Visit: Payer: 59

## 2022-03-02 NOTE — Telephone Encounter (Signed)
Called and spoke with patient and informed her that her lab work was normal and to have a follow up Pap Smear in 3 years. Patient denies any questions or concerns at this time.  ?

## 2022-03-23 ENCOUNTER — Telehealth: Payer: Self-pay | Admitting: Family Medicine

## 2022-03-23 ENCOUNTER — Inpatient Hospital Stay: Admission: RE | Admit: 2022-03-23 | Payer: 59 | Source: Ambulatory Visit

## 2022-03-23 NOTE — Telephone Encounter (Signed)
Patient called back stating that she was returning a call back from some in the office today. ?

## 2022-07-05 IMAGING — US US PELVIS COMPLETE
1 series · 14 of 25 positions shown · non-contrast
Comparison: None.

CLINICAL DATA: Evaluate IUD placement.

EXAM:
TRANSABDOMINAL ULTRASOUND OF PELVIS
TECHNIQUE: Transabdominal ultrasound examination of the pelvis was performed
including evaluation of the uterus, ovaries, adnexal regions, and
pelvic cul-de-sac.

[Series 1: us pelvis complete · 56 acquisitions, 14 frames shown]
[im 1/56]
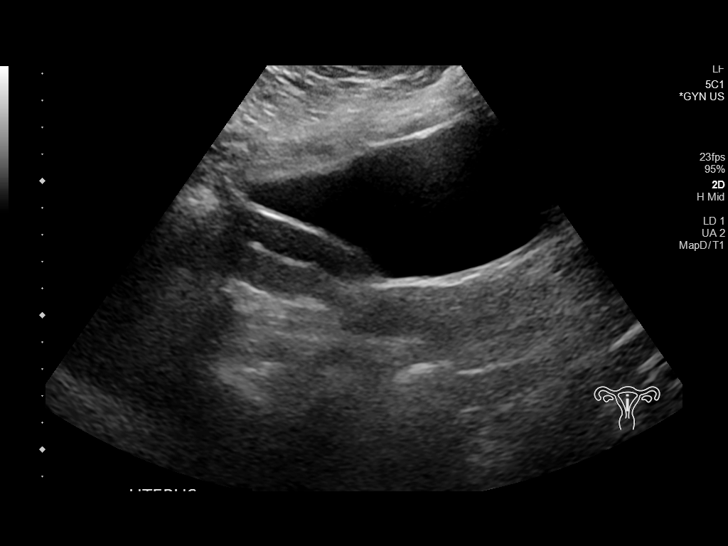
[im 5/56]
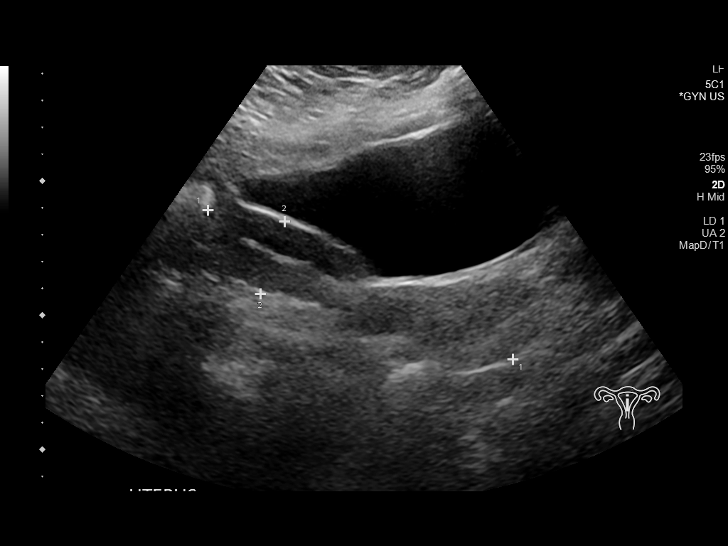
[im 10/56]
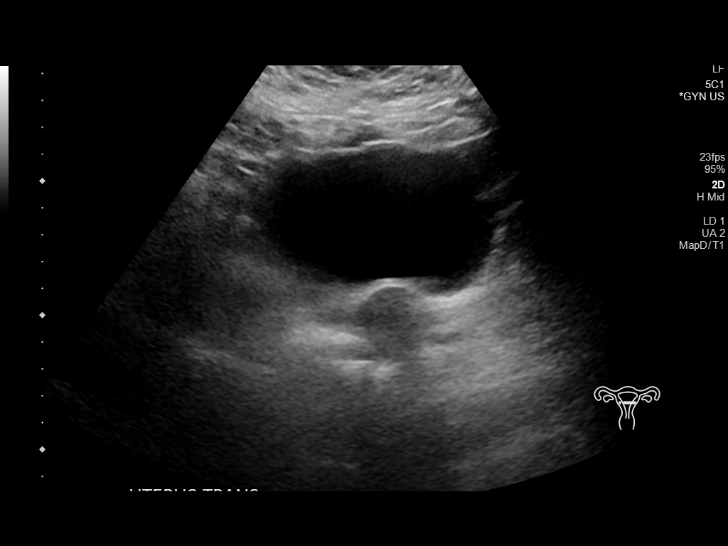
[im 14/56]
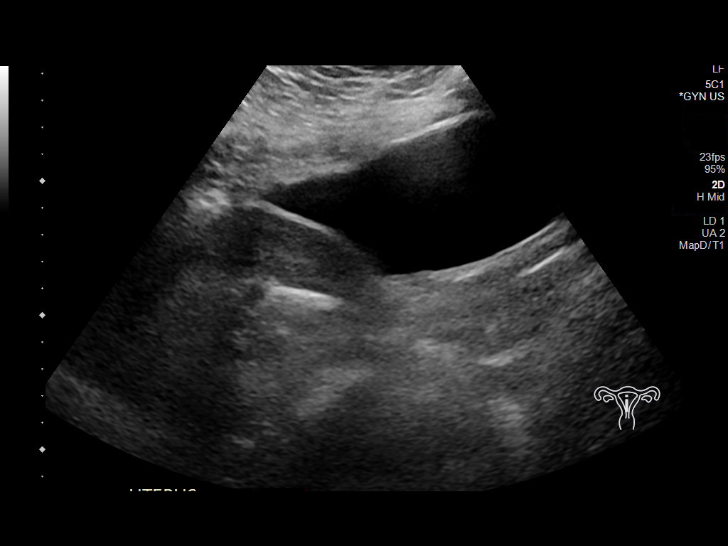
[im 19/56]
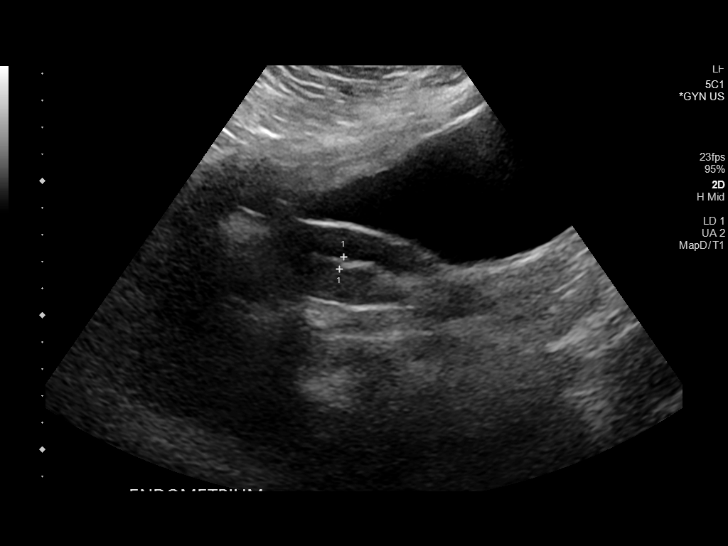
[im 21/56]
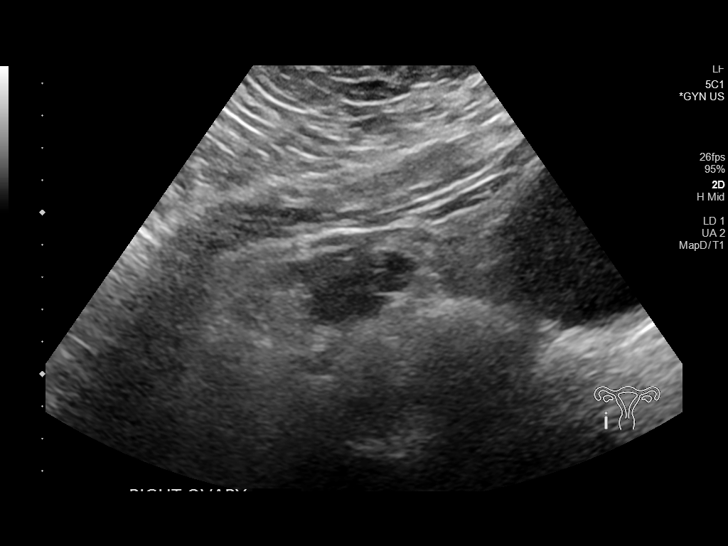
[im 26/56]
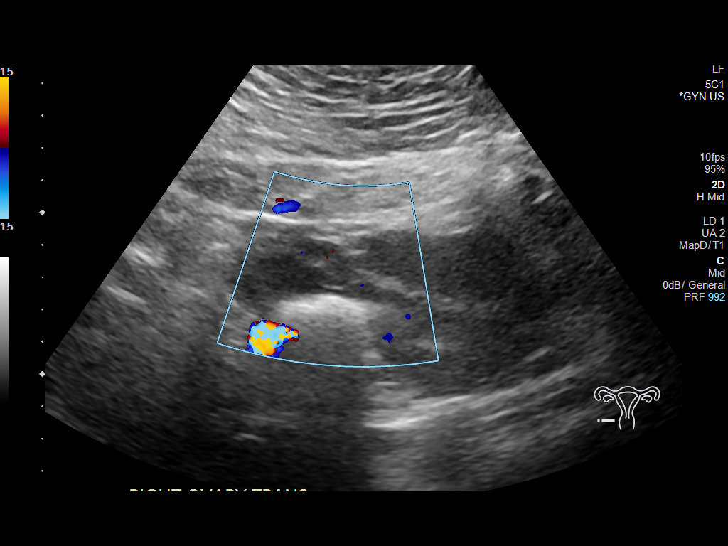
[im 30/56]
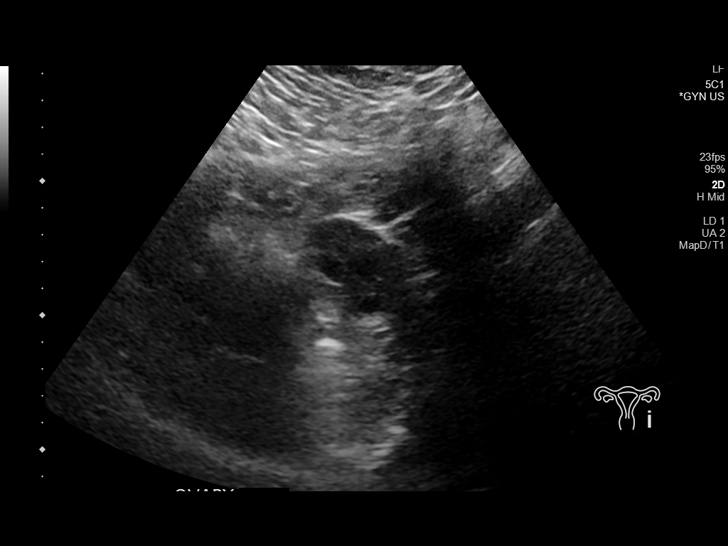
[im 35/56]
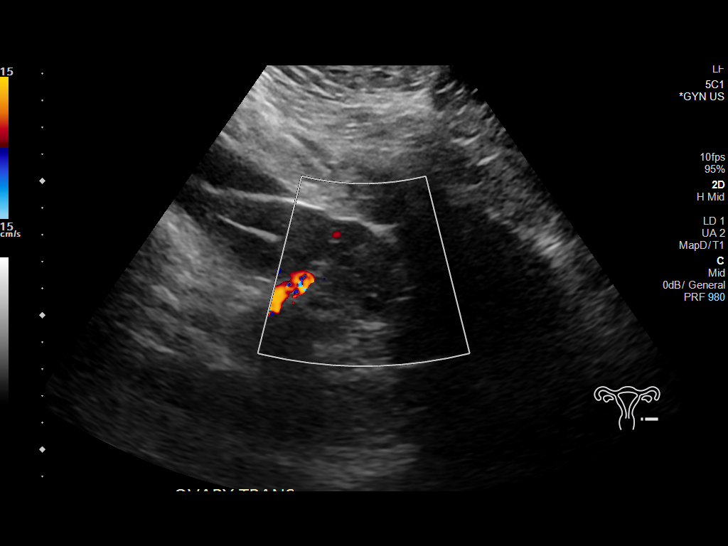
[im 37/56]
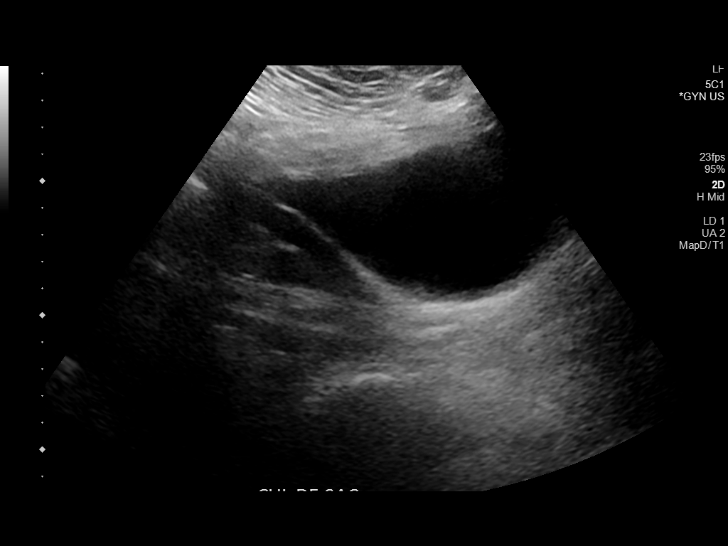
[im 42/56]
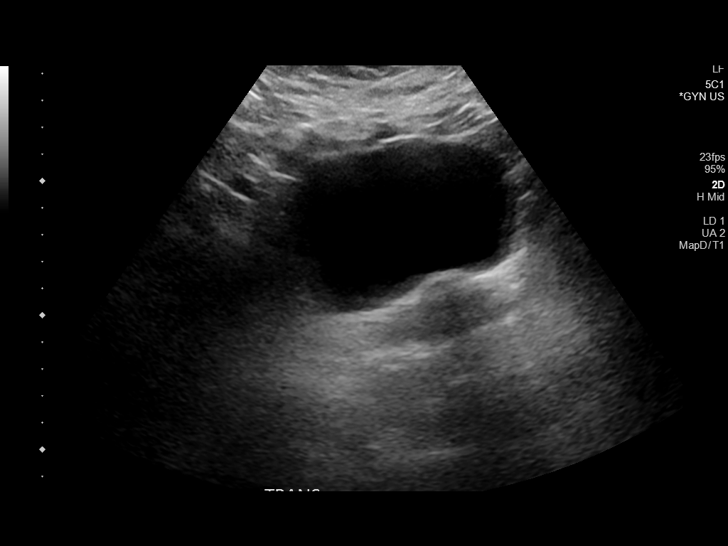
[im 46/56]
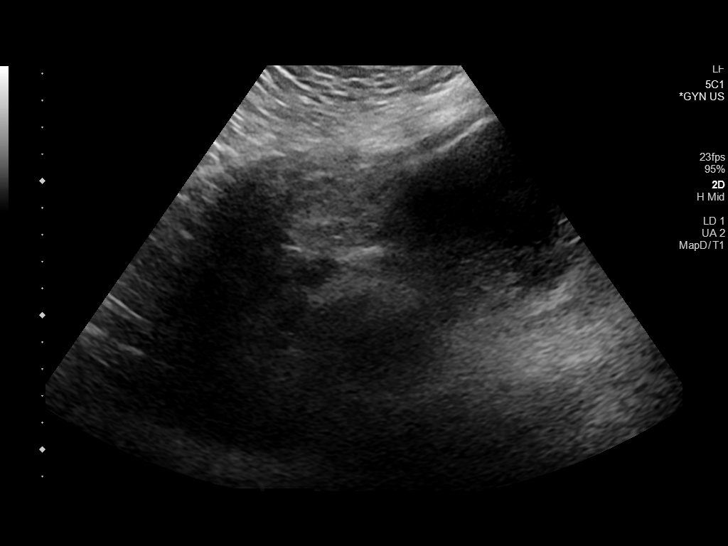
[im 51/56]
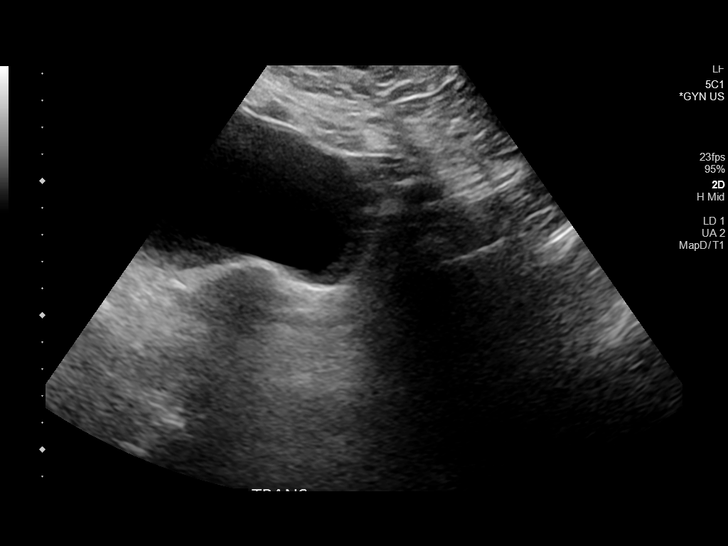
[im 56/56]
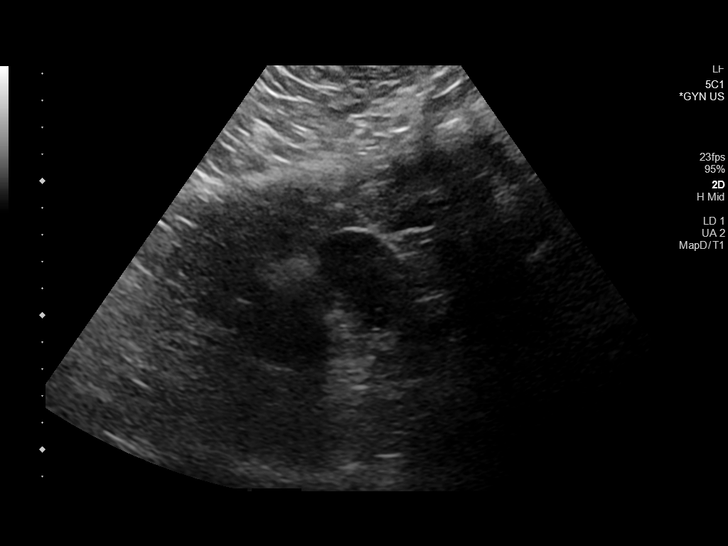

[14 of 25 positions shown; findings below may reference images not displayed]

FINDINGS: Uterus

Measurements: 12.6 x 4.7 x 2.9 cm = volume: 88 mL. No fibroids or
other mass visualized.

Endometrium

Thickness: 5 mm. Echogenic intrauterine device visualized in the
endometrium with T limbs extending in the fundal aspects.

Right ovary

Measurements: 3.8 x 3.8 x 2.1 cm = volume: 16.5 mL. Normal
appearance/no adnexal mass.

Left ovary

Measurements: 4.7 x 3.0 x 2.9 cm = volume: 21 mL. Normal
appearance/no adnexal mass.

Other findings:  No abnormal free fluid.
IMPRESSION: Intrauterine device in place with appropriate positioning.

## 2022-08-30 ENCOUNTER — Telehealth: Payer: Self-pay | Admitting: Family Medicine

## 2022-08-30 ENCOUNTER — Encounter: Payer: Self-pay | Admitting: Obstetrics and Gynecology

## 2022-08-30 DIAGNOSIS — T8332XD Displacement of intrauterine contraceptive device, subsequent encounter: Secondary | ICD-10-CM

## 2022-08-30 NOTE — Telephone Encounter (Signed)
Patient called in returning Linda's call. I advised the patient to send a mychart message and I would also request a nurse call her back.

## 2022-08-30 NOTE — Telephone Encounter (Signed)
Patient called in saying when she tried to reschedule her ultrasound she was told to call us and ask if we can push the date back on the order so it is able to be scheduled.

## 2022-08-30 NOTE — Telephone Encounter (Signed)
I called Alexandra Gamble to clarify her request and reached her voicemail and l left a message I was calling her to discuss and clarify her request and for her to either call us back or send a detailed MyChart message. Staci Acosta

## 2022-08-31 NOTE — Telephone Encounter (Signed)
I called Alexandra Gamble to discuss her request. She informed me she has not pursued getting her [place] rescheduled because she has been busy and started a new job.  She states she is now ready to get [place] rescheduled and get IUD removed. I explained I can schedule the [place] and she will get a MyChart notification. She asked for it to be done at [place] at 11am or preferably at 1pm so she can do it during her lunch break. I also informed her she will probably need another preop visit with Dr. Ilda Basset before surgery is scheduled and that she will be notified if that is the case. I also informed her if Dr. Ilda Basset has a change in the plan of care she will be notified. She voices understanding. [place] scheduled for [employer] 09/02/22 1pm. Staci Acosta

## 2022-09-02 ENCOUNTER — Ambulatory Visit (INDEPENDENT_AMBULATORY_CARE_PROVIDER_SITE_OTHER): Payer: Self-pay

## 2022-09-02 DIAGNOSIS — T8332XD Displacement of intrauterine contraceptive device, subsequent encounter: Secondary | ICD-10-CM

## 2022-09-27 ENCOUNTER — Ambulatory Visit: Payer: Self-pay | Admitting: Obstetrics and Gynecology

## 2022-09-27 ENCOUNTER — Encounter: Payer: Self-pay | Admitting: Obstetrics and Gynecology

## 2022-09-28 NOTE — Progress Notes (Signed)
Patient did not keep her GYN appointment for 09/27/2022.  Cornelia Copa MD Attending [facility])

## 2023-01-24 DIAGNOSIS — F4320 Adjustment disorder, unspecified: Secondary | ICD-10-CM | POA: Diagnosis not present

## 2023-01-30 ENCOUNTER — Ambulatory Visit: Payer: Self-pay | Admitting: Obstetrics and Gynecology

## 2023-03-14 DIAGNOSIS — R300 Dysuria: Secondary | ICD-10-CM | POA: Diagnosis not present

## 2023-03-14 DIAGNOSIS — N390 Urinary tract infection, site not specified: Secondary | ICD-10-CM | POA: Diagnosis not present

## 2023-03-14 DIAGNOSIS — Z299 Encounter for prophylactic measures, unspecified: Secondary | ICD-10-CM | POA: Diagnosis not present

## 2023-03-20 ENCOUNTER — Other Ambulatory Visit: Payer: Self-pay

## 2023-03-20 ENCOUNTER — Encounter: Payer: Self-pay | Admitting: Obstetrics and Gynecology

## 2023-03-20 ENCOUNTER — Ambulatory Visit (INDEPENDENT_AMBULATORY_CARE_PROVIDER_SITE_OTHER): Payer: 59 | Admitting: Obstetrics and Gynecology

## 2023-03-20 VITALS — BP 152/96 | HR 89 | Wt 342.0 lb

## 2023-03-20 DIAGNOSIS — R030 Elevated blood-pressure reading, without diagnosis of hypertension: Secondary | ICD-10-CM | POA: Insufficient documentation

## 2023-03-20 DIAGNOSIS — T8332XD Displacement of intrauterine contraceptive device, subsequent encounter: Secondary | ICD-10-CM

## 2023-03-20 DIAGNOSIS — E559 Vitamin D deficiency, unspecified: Secondary | ICD-10-CM | POA: Diagnosis not present

## 2023-03-20 DIAGNOSIS — Z6841 Body Mass Index (BMI) 40.0 and over, adult: Secondary | ICD-10-CM

## 2023-03-20 DIAGNOSIS — Z01818 Encounter for other preprocedural examination: Secondary | ICD-10-CM | POA: Diagnosis not present

## 2023-03-20 DIAGNOSIS — R5383 Other fatigue: Secondary | ICD-10-CM

## 2023-03-20 MED ORDER — MULTIVITAMINS PO CAPS
1.0000 | ORAL_CAPSULE | Freq: Every day | ORAL | Status: DC
Start: 2023-03-20 — End: 2023-05-17

## 2023-03-20 NOTE — Progress Notes (Unsigned)
Obstetrics and Gynecology Established Patient Evaluation  Appointment Date: 03/20/2023  OB[facility]N Clinic: [facility]   Primary Care Provider: Medicine, [facility]  Chief Complaint: Lost IUD   History of Present Illness: Alexandra Gamble is a 27 y.o. G3P2011 (No LMP recorded. (Menstrual status: IUD).), seen for the above chief complaint. Her past medical history is significant for BMI 50s, h/o c-section.   Patient seen by me for new patient visit for Mirena IUD (placed 2017) removal in June 2022 and unable to see strings or remove in office; pt desired to keep it in at that time and u/s came back wnl. Patient came back on April 2023 and unable to remove again and repeat u/s showed IUD in normal position. Pt was to get OR removal set up but she desired to keep it in as she thought it was affecting her mood but she realized it wasn't. She is using it for birth control and has been amenorrheic on it for two years. She is getting married and would like it removed.   She is also having some fatigue and would like her vitamin d level re-checked  Review of Systems: Pertinent items noted in HPI and remainder of comprehensive ROS otherwise negative.   Patient Active Problem List   Diagnosis Date Noted   IUD strings lost 04/08/2021   Vitamin D deficiency 04/01/2021   Dyslipidemia 04/01/2021   Morbid obesity with BMI of 40.0-44.9, adult (HCC) 02/19/2017   Bacterial vaginosis 01/11/2017    Past Medical History:  Past Medical History:  Diagnosis Date   Obesity     Past Surgical History:  Past Surgical History:  Procedure Laterality Date   CESAREAN SECTION N/A 04/07/2016   Procedure: CESAREAN SECTION;  Surgeon: Willodean Rosenthal, MD;  Location: [facility]IR[facility] SU[facility];  Service: Obstetrics;  Laterality: N/A;    Past Obstetrical History:  OB History  Gravida Para Term Preterm AB Living  3 2 2   1 1   SAB IAB Ectopic Multiple Live  Births  1     0 1    # Outcome Date GA Lbr Len/2nd Weight Sex Delivery Anes PTL Lv  3 Term 04/07/16 [redacted]w[redacted]d  7 lb 7.4 oz (3.385 kg) F CS-LTranv EPI  LIV  2 Term           1 SAB             Past Gynecological History: As per HPI. History of Pap Smear(s): Yes.   Last pap 2023, which was wnl  Social History:  Social History   Socioeconomic History   Marital status: Single    Spouse name: Not on file   Number of children: Not on file   Years of education: Not on file   Highest education level: Not on file  Occupational History   Not on file  Tobacco Use   Smoking status: Never   Smokeless tobacco: Not on file  Substance and Sexual Activity   Alcohol use: No   Drug use: No   Sexual activity: Yes  Other Topics Concern   Not on file  Social History Narrative   Not on file   Social Determinants of Health   Financial Resource Strain: Not on file  Food Insecurity: No Food Insecurity (02/21/2022)   Hunger Vital Sign    Worried About Running Out of Food in the Last Year: Never true    Ran Out of Food in the Last Year: Never true  Transportation  Needs: No Transportation Needs (02/21/2022)   PRAPARE - Administrator, Civil Service (Medical): No    Lack of Transportation (Non-Medical): No  Physical Activity: Not on file  Stress: Not on file  Social Connections: Not on file  Intimate Partner Violence: Not on file    Family History:  Family History  Problem Relation Age of Onset   Diabetes Maternal Grandmother    Hypertension Maternal Grandmother    Heart disease Maternal Grandmother     Medications None  Allergies Patient has no known allergies.   Physical Exam:  BP (!) 117/90   Pulse 82   Wt (!) 342 lb (155.1 kg)   BMI 55.20 kg/m  Body mass index is 55.2 kg/m. General appearance: Well nourished, well developed female in no acute distress.  Cardiovascular: normal s1 and s2.  No murmurs, rubs or gallops. Respiratory:  Clear to auscultation bilateral.  Normal respiratory effort Neuro/Psych:  Normal mood and affect.  Skin:  Warm and dry.   Laboratory: none  Radiology: no new imaging   Assessment: patient stable  Plan:  1. Intrauterine contraceptive device threads lost, subsequent encounter Pt doesn't desire another trial in the office and desires in the OR.   2. Vitamin D deficiency - Hemoglobin A1c - CBC - Comprehensive metabolic panel - TSH Rfx on Abnormal to Free T4 - VITAMIN D 25 Hydroxy (Vit-D Deficiency, Fractures)  3. Other fatigue - Hemoglobin A1c - CBC - Comprehensive metabolic panel - TSH Rfx on Abnormal to Free T4 - VITAMIN D 25 Hydroxy (Vit-D Deficiency, Fractures)  4. Pre-op examination - Hemoglobin A1c - CBC - Comprehensive metabolic panel - TSH Rfx on Abnormal to Free T4 - VITAMIN D 25 Hydroxy (Vit-D Deficiency, Fractures)  5. BMI 50s/Pregnancy Weight loss recommended and touching base with PCP to optimize health to minimize complications with potential pregnancy such as DM, HTN  Orders Placed This Encounter  Procedures   Hemoglobin A1c   CBC   Comprehensive metabolic panel   TSH Rfx on Abnormal to Free T4   VITAMIN D 25 Hydroxy (Vit-D Deficiency, Fractures)   Referral to Nutrition and Diabetes Services    RTC post op   Cornelia Copa MD Attending [facility])

## 2023-03-21 LAB — CBC
Hematocrit: 37.9 % (ref 34.0–46.6)
Hemoglobin: 12.2 g/dL (ref 11.1–15.9)
MCH: 26 pg — ABNORMAL LOW (ref 26.6–33.0)
MCHC: 32.2 g/dL (ref 31.5–35.7)
MCV: 81 fL (ref 79–97)
Platelets: 390 10*3/uL (ref 150–450)
RBC: 4.7 x10E6/uL (ref 3.77–5.28)
RDW: 14.2 % (ref 11.7–15.4)
WBC: 5 10*3/uL (ref 3.4–10.8)

## 2023-03-21 LAB — COMPREHENSIVE METABOLIC PANEL
ALT: 17 IU/L (ref 0–32)
AST: 18 IU/L (ref 0–40)
Albumin/Globulin Ratio: 1.5 (ref 1.2–2.2)
Albumin: 4.1 g/dL (ref 4.0–5.0)
Alkaline Phosphatase: 98 IU/L (ref 44–121)
BUN/Creatinine Ratio: 11 (ref 9–23)
BUN: 8 mg/dL (ref 6–20)
Bilirubin Total: 0.3 mg/dL (ref 0.0–1.2)
CO2: 20 mmol/L (ref 20–29)
Calcium: 9.2 mg/dL (ref 8.7–10.2)
Chloride: 102 mmol/L (ref 96–106)
Creatinine, Ser: 0.7 mg/dL (ref 0.57–1.00)
Globulin, Total: 2.7 g/dL (ref 1.5–4.5)
Glucose: 131 mg/dL — ABNORMAL HIGH (ref 70–99)
Potassium: 4.3 mmol/L (ref 3.5–5.2)
Sodium: 140 mmol/L (ref 134–144)
Total Protein: 6.8 g/dL (ref 6.0–8.5)
eGFR: 121 mL/min/{1.73_m2} (ref >59)

## 2023-03-21 LAB — HEMOGLOBIN A1C
Est. average glucose Bld gHb Est-mCnc: 123 mg/dL
Hgb A1c MFr Bld: 5.9 % — ABNORMAL HIGH (ref 4.8–5.6)

## 2023-03-21 LAB — TSH RFX ON ABNORMAL TO FREE T4: TSH: 1.29 u[IU]/mL (ref 0.450–4.500)

## 2023-03-21 LAB — VITAMIN D 25 HYDROXY (VIT D DEFICIENCY, FRACTURES): Vit D, 25-Hydroxy: 10.4 ng/mL — ABNORMAL LOW (ref 30.0–100.0)

## 2023-03-24 ENCOUNTER — Ambulatory Visit (INDEPENDENT_AMBULATORY_CARE_PROVIDER_SITE_OTHER): Payer: 59 | Admitting: Nurse Practitioner

## 2023-03-24 ENCOUNTER — Encounter: Payer: Self-pay | Admitting: Nurse Practitioner

## 2023-03-24 VITALS — BP 122/82 | HR 87 | Temp 97.2°F | Wt 345.6 lb

## 2023-03-24 DIAGNOSIS — E785 Hyperlipidemia, unspecified: Secondary | ICD-10-CM | POA: Diagnosis not present

## 2023-03-24 DIAGNOSIS — E559 Vitamin D deficiency, unspecified: Secondary | ICD-10-CM

## 2023-03-24 DIAGNOSIS — Z23 Encounter for immunization: Secondary | ICD-10-CM | POA: Insufficient documentation

## 2023-03-24 DIAGNOSIS — Z6841 Body Mass Index (BMI) 40.0 and over, adult: Secondary | ICD-10-CM

## 2023-03-24 DIAGNOSIS — Z13220 Encounter for screening for lipoid disorders: Secondary | ICD-10-CM | POA: Diagnosis not present

## 2023-03-24 DIAGNOSIS — R7303 Prediabetes: Secondary | ICD-10-CM

## 2023-03-24 MED ORDER — PHENTERMINE HCL 15 MG PO CAPS: 15.0000 mg | ORAL_CAPSULE | ORAL | 0 refills | Status: DC

## 2023-03-24 MED ORDER — VITAMIN D (ERGOCALCIFEROL) 1.25 MG (50000 UNIT) PO CAPS: 50000.0000 [IU] | ORAL_CAPSULE | ORAL | 0 refills | Status: DC

## 2023-03-24 NOTE — Assessment & Plan Note (Signed)
Checking fasting lipid panel Avoid fatty fried foods Lose weight

## 2023-03-24 NOTE — Assessment & Plan Note (Signed)
Last vitamin D Lab Results  Component Value Date   VD25OH 10.4 (L) 03/20/2023  Start vitamin D 50,000 units once weekly for 8 weeks, after 8 weeks take vitamin D 1000 units daily.  Labs in 3 months

## 2023-03-24 NOTE — Assessment & Plan Note (Signed)
Lab Results  Component Value Date   HGBA1C 5.9 (H) 03/20/2023  Avoid sugar sweets soda Lose weight

## 2023-03-24 NOTE — Assessment & Plan Note (Signed)
Wt Readings from Last 3 Encounters:  03/24/23 (!) 345 lb 9.6 oz (156.8 kg)  03/20/23 (!) 342 lb (155.1 kg)  02/21/22 (!) 315 lb 11.2 oz (143.2 kg)   Body mass index is 55.78 kg/m.  Need to increase intake of whole food consisting mainly vegetables and protein less carbohydrate drinking at least 64 ounces of water daily engaging in regular moderate to vigorous exercises at least 150 minutes weekly, importance of portion control also discussed. Benefits of healthy weight discussed Rx phentermine 15 mg daily, side effects of phentermine discussed. Will do phentermine for 3 to 6 months Patient referred to medical weight management clinic in bariatric surgery Follow-up in 4 weeks

## 2023-03-24 NOTE — Assessment & Plan Note (Signed)
Patient educated on CDC recommendation for the tadp vaccine. Verbal consent was obtained from the patient, vaccine administered by nurse, no sign of adverse reactions noted at this time. Patient education on arm soreness and use of tylenol or  for this patient  was discussed. Patient educated on the signs and symptoms of adverse effect and advise to contact the office if they occur.

## 2023-03-24 NOTE — Addendum Note (Signed)
Addended byRaj Janus on: 03/24/2023 12:05 PM   Modules accepted: Orders

## 2023-03-24 NOTE — Progress Notes (Signed)
New Patient Office Visit  Subjective:  Patient ID: Alexandra Gamble, female    DOB: Oct 11, 1996  Age: 27 y.o. MRN: [id number]  CC:  Chief Complaint  Patient presents with   Establish Care    HPI Alexandra Gamble is a 27 y.o. female  has a past medical history of Obesity and Vitamin D deficiency.prediabetes who presents to establish care for her chronic medical conditions .previous PCP was at [facility].      Obesity .chronic condition , she has tried phentermine in the past that helped her lose some weight .recently started exercising 5 days a week 15 minutes daily , she is also working on her diet .was also being followed by bariatric surgery but due to insurance reasons could not follow-up with them.  Would like to try phentermine again and be referred to bariatric surgery.  Due for Tdap vaccine Tdap vaccine administered in the office today.    Past Medical History:  Diagnosis Date   Obesity    Vitamin D deficiency     Past Surgical History:  Procedure Laterality Date   CESAREAN SECTION N/A 04/07/2016   Procedure: CESAREAN SECTION;  Surgeon: Willodean Rosenthal, MD;  Location: [facility] BIRTHING SUITES;  Service: Obstetrics;  Laterality: N/A;    Family History  Problem Relation Age of Onset   Diabetes Mother    Hypertension Father    Obesity Brother    Diabetes Brother    Diabetes Maternal Grandmother    Hypertension Maternal Grandmother    Heart disease Maternal Grandmother    Leukemia Other     Social History   Socioeconomic History   Marital status: Single    Spouse name: Not on file   Number of children: 2   Years of education: Not on file   Highest education level: Not on file  Occupational History   Not on file  Tobacco Use   Smoking status: Never   Smokeless tobacco: Not on file  Substance and Sexual Activity   Alcohol use: Yes    Comment: occasionally   Drug use: No   Sexual activity: Yes  Other Topics Concern   Not on file  Social  History Narrative   Live with her boyfriend and children    Social Determinants of Health   Financial Resource Strain: Not on file  Food Insecurity: No Food Insecurity (02/21/2022)   Hunger Vital Sign    Worried About Running Out of Food in the Last Year: Never true    Ran Out of Food in the Last Year: Never true  Transportation Needs: No Transportation Needs (02/21/2022)   PRAPARE - Administrator, Civil Service (Medical): No    Lack of Transportation (Non-Medical): No  Physical Activity: Not on file  Stress: Not on file  Social Connections: Not on file  Intimate Partner Violence: Not on file    ROS Review of Systems  Constitutional:  Negative for activity change, appetite change, chills, fatigue and fever.  HENT:  Negative for congestion, dental problem, ear discharge, ear pain, hearing loss, rhinorrhea, sinus pressure, sinus pain, sneezing and sore throat.   Eyes:  Negative for pain, discharge, redness and itching.  Respiratory:  Negative for cough, chest tightness, shortness of breath and wheezing.   Cardiovascular:  Negative for chest pain, palpitations and leg swelling.  Gastrointestinal:  Negative for abdominal distention, abdominal pain, anal bleeding, blood in stool, constipation, diarrhea, nausea, rectal pain and vomiting.  Endocrine: Negative for cold intolerance, heat intolerance,  polydipsia, polyphagia and polyuria.  Genitourinary:  Negative for difficulty urinating, dysuria, flank pain, frequency, hematuria, menstrual problem, pelvic pain and vaginal bleeding.  Musculoskeletal:  Negative for arthralgias, back pain, gait problem, joint swelling and myalgias.  Skin:  Negative for color change, pallor, rash and wound.  Allergic/Immunologic: Negative for environmental allergies, food allergies and immunocompromised state.  Neurological:  Negative for dizziness, tremors, facial asymmetry, weakness and headaches.  Hematological:  Negative for adenopathy. Does not  bruise/bleed easily.  Psychiatric/Behavioral:  Negative for agitation, behavioral problems, confusion, decreased concentration, hallucinations, self-injury and suicidal ideas.     Objective:   Today's Vitals: BP 122/82   Pulse 87   Temp (!) 97.2 F (36.2 C)   Wt (!) 345 lb 9.6 oz (156.8 kg)   SpO2 98%   BMI 55.78 kg/m   Physical Exam Vitals and nursing note reviewed.  Constitutional:      General: She is not in acute distress.    Appearance: Normal appearance. She is obese. She is not ill-appearing, toxic-appearing or diaphoretic.  HENT:     Mouth/Throat:     Mouth: Mucous membranes are moist.     Pharynx: Oropharynx is clear. No oropharyngeal exudate or posterior oropharyngeal erythema.  Eyes:     General: No scleral icterus.       Right eye: No discharge.        Left eye: No discharge.     Extraocular Movements: Extraocular movements intact.     Conjunctiva/sclera: Conjunctivae normal.  Cardiovascular:     Rate and Rhythm: Normal rate and regular rhythm.     Pulses: Normal pulses.     Heart sounds: Normal heart sounds. No murmur heard.    No friction rub. No gallop.  Pulmonary:     Effort: Pulmonary effort is normal. No respiratory distress.     Breath sounds: Normal breath sounds. No stridor. No wheezing, rhonchi or rales.  Chest:     Chest wall: No tenderness.  Abdominal:     General: There is no distension.     Palpations: Abdomen is soft.     Tenderness: There is no abdominal tenderness. There is no right CVA tenderness, left CVA tenderness or guarding.  Musculoskeletal:        General: No swelling, tenderness, deformity or signs of injury.     Right lower leg: No edema.     Left lower leg: No edema.  Skin:    General: Skin is warm and dry.     Capillary Refill: Capillary refill takes 2 to 3 seconds.     Coloration: Skin is not jaundiced or pale.     Findings: No bruising, erythema or lesion.  Neurological:     Mental Status: She is alert and oriented to  person, place, and time.     Motor: No weakness.     Coordination: Coordination normal.     Gait: Gait normal.  Psychiatric:        Mood and Affect: Mood normal.        Behavior: Behavior normal.        Thought Content: Thought content normal.        Judgment: Judgment normal.     Assessment & Plan:   Problem List Items Addressed This Visit       Other   Vitamin D deficiency    Last vitamin D Lab Results  Component Value Date   VD25OH 10.4 (L) 03/20/2023  Start vitamin D 50,000 units once weekly for 8  weeks, after 8 weeks take vitamin D 1000 units daily.  Labs in 3 months      Relevant Medications   Vitamin D, Ergocalciferol, (DRISDOL) 1.25 MG (50000 UNIT) CAPS capsule   Dyslipidemia    Checking fasting lipid panel Avoid fatty fried foods Lose weight      Relevant Orders   Lipid Panel   BMI 50.0-59.9, adult (HCC) - Primary    Wt Readings from Last 3 Encounters:  03/24/23 (!) 345 lb 9.6 oz (156.8 kg)  03/20/23 (!) 342 lb (155.1 kg)  02/21/22 (!) 315 lb 11.2 oz (143.2 kg)   Body mass index is 55.78 kg/m.  Need to increase intake of whole food consisting mainly vegetables and protein less carbohydrate drinking at least 64 ounces of water daily engaging in regular moderate to vigorous exercises at least 150 minutes weekly, importance of portion control also discussed. Benefits of healthy weight discussed Rx phentermine 15 mg daily, side effects of phentermine discussed. Will do phentermine for 3 to 6 months Patient referred to medical weight management clinic in bariatric surgery Follow-up in 4 weeks      Relevant Medications   phentermine 15 MG capsule   Other Relevant Orders   Lipid Panel   Amb Ref to Medical Weight Management   Amb Referral to Bariatric Surgery   Need for diphtheria-tetanus-pertussis (Tdap) vaccine    Patient educated on CDC recommendation for the tadp vaccine. Verbal consent was obtained from the patient, vaccine administered by nurse, no  sign of adverse reactions noted at this time. Patient education on arm soreness and use of tylenol or  for this patient  was discussed. Patient educated on the signs and symptoms of adverse effect and advise to contact the office if they occur.       Prediabetes    Lab Results  Component Value Date   HGBA1C 5.9 (H) 03/20/2023  Avoid sugar sweets soda Lose weight       Outpatient Encounter Medications as of 03/24/2023  Medication Sig   phentermine 15 MG capsule Take 1 capsule (15 mg total) by mouth every morning.   Vitamin D, Ergocalciferol, (DRISDOL) 1.25 MG (50000 UNIT) CAPS capsule Take 1 capsule (50,000 Units total) by mouth every 7 (seven) days.   buPROPion (WELLBUTRIN SR) 150 MG 12 hr tablet Take 150 mg by mouth every morning. (Patient not taking: Reported on 03/20/2023)   Multiple Vitamin (MULTIVITAMIN) capsule Take 1 capsule by mouth daily. (Patient not taking: Reported on 03/24/2023)   [DISCONTINUED] OZEMPIC, 0.25 OR 0.5 MG/DOSE, 2 MG/1.5ML SOPN Inject into the skin. (Patient not taking: Reported on 03/20/2023)   No facility-administered encounter medications on file as of 03/24/2023.    Follow-up: Return in about 4 weeks (around 04/21/2023) for obesity.   Donell Beers, FNP

## 2023-03-24 NOTE — Patient Instructions (Signed)
1. Vitamin D deficiency  - Vitamin D, Ergocalciferol, (DRISDOL) 1.25 MG (50000 UNIT) CAPS capsule; Take 1 capsule (50,000 Units total) by mouth every 7 (seven) days.  Dispense: 8 capsule; Refill: 0  2. BMI 50.0-59.9, adult (HCC)  - Lipid Panel - phentermine 15 MG capsule; Take 1 capsule (15 mg total) by mouth every morning.  Dispense: 30 capsule; Refill: 0 - Amb Ref to Medical Weight Management - Amb Referral to Bariatric Surgery  3. Need for diphtheria-tetanus-pertussis (Tdap) vaccine   4. Screening for lipid disorders  - Lipi Panel    It is important that you exercise regularly at least 30 minutes 5 times a week as tolerated  Think about what you will eat, plan ahead. Choose " clean, green, fresh or frozen" over canned, processed or packaged foods which are more sugary, salty and fatty. 70 to 75% of food eaten should be vegetables and fruit. Three meals at set times with snacks allowed between meals, but they must be fruit or vegetables. Aim to eat over a 12 hour period , example 7 am to 7 pm, and STOP after  your last meal of the day. Drink water,generally about 64 ounces per day, no other drink is as healthy. Fruit juice is best enjoyed in a healthy way, by EATING the fruit.  Thanks for choosing [facility] we consider it a privelige to serve you.

## 2023-03-25 LAB — LIPID PANEL
Chol/HDL Ratio: 3.8 ratio (ref 0.0–4.4)
Cholesterol, Total: 177 mg/dL (ref 100–199)
HDL: 46 mg/dL (ref >39)
LDL Chol Calc (NIH): 118 mg/dL — ABNORMAL HIGH (ref 0–99)
Triglycerides: 66 mg/dL (ref 0–149)
VLDL Cholesterol Cal: 13 mg/dL (ref 5–40)

## 2023-03-27 DIAGNOSIS — L218 Other seborrheic dermatitis: Secondary | ICD-10-CM | POA: Diagnosis not present

## 2023-04-21 ENCOUNTER — Encounter: Payer: Self-pay | Admitting: Nurse Practitioner

## 2023-04-21 ENCOUNTER — Ambulatory Visit (INDEPENDENT_AMBULATORY_CARE_PROVIDER_SITE_OTHER): Payer: 59 | Admitting: Nurse Practitioner

## 2023-04-21 VITALS — BP 123/67 | HR 93 | Temp 97.4°F | Ht 66.0 in | Wt 342.2 lb

## 2023-04-21 DIAGNOSIS — E559 Vitamin D deficiency, unspecified: Secondary | ICD-10-CM | POA: Diagnosis not present

## 2023-04-21 DIAGNOSIS — Z6841 Body Mass Index (BMI) 40.0 and over, adult: Secondary | ICD-10-CM | POA: Diagnosis not present

## 2023-04-21 DIAGNOSIS — Z113 Encounter for screening for infections with a predominantly sexual mode of transmission: Secondary | ICD-10-CM | POA: Diagnosis not present

## 2023-04-21 MED ORDER — PHENTERMINE HCL 37.5 MG PO CAPS: 37.5000 mg | ORAL_CAPSULE | ORAL | 2 refills | Status: DC

## 2023-04-21 NOTE — Assessment & Plan Note (Addendum)
Wt Readings from Last 3 Encounters:  04/21/23 (!) 342 lb 3.2 oz (155.2 kg)  03/24/23 (!) 345 lb 9.6 oz (156.8 kg)  03/20/23 (!) 342 lb (155.1 kg)   Body mass index is 55.23 kg/m.  Patient has lost 3 pounds since her last visit Start phentermine 37.5 mg once daily Patient counseled on low-carb modified diet She was encouraged to engage in regular moderate to vigorous exercise with at least 150 minutes weekly Follow-up with the medical weight management clinic and bariatric surgery Follow-up in 3 months

## 2023-04-21 NOTE — Progress Notes (Signed)
Established Patient Office Visit  Subjective:  Patient ID: Alexandra Gamble, female    DOB: Mar 27, 1996  Age: 27 y.o. MRN: [id number]  CC:  Chief Complaint  Patient presents with   Follow-up    HPI Alexandra Gamble is a 27 y.o. female  has a past medical history of Obesity and Vitamin D deficiency.  Patient presents for follow-up for obesity .    Obesity. currently taking phentermine 15 mg daily.  She has started some walking exercises and does not eat out anymore.  Patient denies any adverse reactions to this medication.   Patient would like STD testing, she has 1 sexual partner ,denies vaginal discharge, vaginitis,.  States that she just likes to have screening  done regularly   Past Medical History:  Diagnosis Date   Obesity    Vitamin D deficiency     Past Surgical History:  Procedure Laterality Date   CESAREAN SECTION N/A 04/07/2016   Procedure: CESAREAN SECTION;  Surgeon: Willodean Rosenthal, MD;  Location: [facility]IRTHING SU[facility];  Service: Obstetrics;  Laterality: N/A;    Family History  Problem Relation Age of Onset   Diabetes Mother    Hypertension Father    Obesity Brother    Diabetes Brother    Diabetes Maternal Grandmother    Hypertension Maternal Grandmother    Heart disease Maternal Grandmother    Leukemia Other     Social History   Socioeconomic History   Marital status: Single    Spouse name: Not on file   Number of children: 2   Years of education: Not on file   Highest education level: Not on file  Occupational History   Not on file  Tobacco Use   Smoking status: Never   Smokeless tobacco: Not on file  Substance and Sexual Activity   Alcohol use: Yes    Comment: occasionally   Drug use: No   Sexual activity: Yes  Other Topics Concern   Not on file  Social History Narrative   Live with her boyfriend and children    Social Determinants of Health   Financial Resource Strain: Not on file  Food Insecurity: No Food Insecurity  (02/21/2022)   Hunger Vital Sign    Worried About Running Out of Food in the Last Year: Never true    Ran Out of Food in the Last Year: Never true  Transportation Needs: No Transportation Needs (02/21/2022)   PRAPARE - Administrator, Civil Service (Medical): No    Lack of Transportation (Non-Medical): No  Physical Activity: Not on file  Stress: Not on file  Social Connections: Not on file  Intimate Partner Violence: Not on file    Outpatient Medications Prior to Visit  Medication Sig Dispense Refill   Vitamin D, Ergocalciferol, (DRISDOL) 1.25 MG (50000 UNIT) CAPS capsule Take 1 capsule (50,000 Units total) by mouth every 7 (seven) days. 8 capsule 0   phentermine 15 MG capsule Take 1 capsule (15 mg total) by mouth every morning. 30 capsule 0   buPROPion (WELLBUTRIN SR) 150 MG 12 hr tablet Take 150 mg by mouth every morning. (Patient not taking: Reported on 03/20/2023)     Multiple Vitamin (MULTIVITAMIN) capsule Take 1 capsule by mouth daily. (Patient not taking: Reported on 03/24/2023)     No facility-administered medications prior to visit.    No Known Allergies  ROS Review of Systems  Constitutional:  Negative for activity change, appetite change, chills, fatigue and fever.  HENT:  Negative  for congestion, dental problem, ear discharge, ear pain, hearing loss, rhinorrhea, sinus pressure, sinus pain, sneezing and sore throat.   Eyes:  Negative for pain, discharge, redness and itching.  Respiratory:  Negative for cough, chest tightness, shortness of breath and wheezing.   Cardiovascular:  Negative for chest pain, palpitations and leg swelling.  Gastrointestinal:  Negative for abdominal distention, abdominal pain, anal bleeding, blood in stool, constipation, diarrhea, nausea, rectal pain and vomiting.  Endocrine: Negative for cold intolerance, heat intolerance, polydipsia, polyphagia and polyuria.  Genitourinary:  Negative for difficulty urinating, dysuria, flank pain,  frequency, hematuria, menstrual problem, pelvic pain and vaginal bleeding.  Musculoskeletal:  Negative for arthralgias, back pain, gait problem, joint swelling and myalgias.  Skin:  Negative for color change, pallor, rash and wound.  Allergic/Immunologic: Negative for environmental allergies, food allergies and immunocompromised state.  Neurological:  Negative for dizziness, tremors, facial asymmetry, weakness and headaches.  Hematological:  Negative for adenopathy. Does not bruise/bleed easily.  Psychiatric/Behavioral:  Negative for agitation, behavioral problems, confusion, decreased concentration, hallucinations, self-injury and suicidal ideas.       Objective:    Physical Exam Vitals and nursing note reviewed.  Constitutional:      General: She is not in acute distress.    Appearance: Normal appearance. She is obese. She is not ill-appearing, toxic-appearing or diaphoretic.  HENT:     Mouth/Throat:     Mouth: Mucous membranes are moist.     Pharynx: Oropharynx is clear. No oropharyngeal exudate or posterior oropharyngeal erythema.  Eyes:     General: No scleral icterus.       Right eye: No discharge.        Left eye: No discharge.     Extraocular Movements: Extraocular movements intact.     Conjunctiva/sclera: Conjunctivae normal.  Cardiovascular:     Rate and Rhythm: Normal rate and regular rhythm.     Pulses: Normal pulses.     Heart sounds: Normal heart sounds. No murmur heard.    No friction rub. No gallop.  Pulmonary:     Effort: Pulmonary effort is normal. No respiratory distress.     Breath sounds: Normal breath sounds. No stridor. No wheezing, rhonchi or rales.  Chest:     Chest wall: No tenderness.  Abdominal:     General: There is no distension.     Palpations: Abdomen is soft.     Tenderness: There is no abdominal tenderness. There is no right CVA tenderness, left CVA tenderness or guarding.  Musculoskeletal:        General: No swelling, tenderness, deformity  or signs of injury.     Right lower leg: No edema.     Left lower leg: No edema.  Skin:    General: Skin is warm and dry.     Capillary Refill: Capillary refill takes 2 to 3 seconds.     Coloration: Skin is not jaundiced or pale.     Findings: No bruising, erythema or lesion.  Neurological:     Mental Status: She is alert and oriented to person, place, and time.     Motor: No weakness.     Coordination: Coordination normal.     Gait: Gait normal.  Psychiatric:        Mood and Affect: Mood normal.        Behavior: Behavior normal.        Thought Content: Thought content normal.        Judgment: Judgment normal.     BP  123/67   Pulse 93   Temp (!) 97.4 F (36.3 C)   Ht 5\' 6"  (1.676 m)   Wt (!) 342 lb 3.2 oz (155.2 kg)   SpO2 100%   BMI 55.23 kg/m  Wt Readings from Last 3 Encounters:  04/21/23 (!) 342 lb 3.2 oz (155.2 kg)  03/24/23 (!) 345 lb 9.6 oz (156.8 kg)  03/20/23 (!) 342 lb (155.1 kg)    Lab Results  Component Value Date   TSH 1.290 03/20/2023   Lab Results  Component Value Date   WBC 5.0 03/20/2023   HGB 12.2 03/20/2023   HCT 37.9 03/20/2023   MCV 81 03/20/2023   PLT 390 03/20/2023   Lab Results  Component Value Date   NA 140 03/20/2023   K 4.3 03/20/2023   CO2 20 03/20/2023   GLUCOSE 131 (H) 03/20/2023   BUN 8 03/20/2023   CREATININE 0.70 03/20/2023   BILITOT 0.3 03/20/2023   ALKPHOS 98 03/20/2023   AST 18 03/20/2023   ALT 17 03/20/2023   PROT 6.8 03/20/2023   ALBUMIN 4.1 03/20/2023   CALCIUM 9.2 03/20/2023   EGFR 121 03/20/2023   Lab Results  Component Value Date   CHOL 177 03/24/2023   Lab Results  Component Value Date   HDL 46 03/24/2023   Lab Results  Component Value Date   LDLCALC 118 (H) 03/24/2023   Lab Results  Component Value Date   TRIG 66 03/24/2023   Lab Results  Component Value Date   CHOLHDL 3.8 03/24/2023   Lab Results  Component Value Date   HGBA1C 5.9 (H) 03/20/2023      Assessment & Plan:   Problem  List Items Addressed This Visit       Other   Vitamin D deficiency     continue vitamin D 50,000 units once weekly dose.  After 8 weeks take vitamin D 1000 units daily.  We will recheck your labs at next appointment       BMI 50.0-59.9, adult [facility]) - Primary    Wt Readings from Last 3 Encounters:  04/21/23 (!) 342 lb 3.2 oz (155.2 kg)  03/24/23 (!) 345 lb 9.6 oz (156.8 kg)  03/20/23 (!) 342 lb (155.1 kg)   Body mass index is 55.23 kg/m.  Patient has lost 3 pounds since her last visit Start phentermine 37.5 mg once daily Patient counseled on low-carb modified diet She was encouraged to engage in regular moderate to vigorous exercise with at least 150 minutes weekly Follow-up with the medical weight management clinic and bariatric surgery Follow-up in 3 months      Relevant Medications   phentermine 37.5 MG capsule   Other Visit Diagnoses     Screening examination for STI       Relevant Orders   NuSwab Vaginitis Plus (VG+)       Meds ordered this encounter  Medications   phentermine 37.5 MG capsule    Sig: Take 1 capsule (37.5 mg total) by mouth every morning.    Dispense:  30 capsule    Refill:  2    Follow-up: Return in about 3 months (around 07/22/2023), or obesity.    Donell Beers, FNP

## 2023-04-21 NOTE — Patient Instructions (Addendum)
1. BMI 50.0-59.9, adult (HCC)  - phentermine 37.5 MG capsule; Take 1 capsule (37.5 mg total) by mouth every morning.  Dispense: 30 capsule; Refill: 2  2. Screening examination for STI  - NuSwab Vaginitis Plus (VG+)    vitamin D deficiency please continue vitamin D 50,000 units once weekly dose.  After 8 weeks take vitamin D 1000 units daily.  We will recheck your labs at your next appointment   It is important that you exercise regularly at least 30 minutes 5 times a week as tolerated  Think about what you will eat, plan ahead. Choose " clean, green, fresh or frozen" over canned, processed or packaged foods which are more sugary, salty and fatty. 70 to 75% of food eaten should be vegetables and fruit. Three meals at set times with snacks allowed between meals, but they must be fruit or vegetables. Aim to eat over a 12 hour period , example 7 am to 7 pm, and STOP after  your last meal of the day. Drink water,generally about 64 ounces per day, no other drink is as healthy. Fruit juice is best enjoyed in a healthy way, by EATING the fruit.  Thanks for choosing [facility] we consider it a privelige to serve you.

## 2023-04-21 NOTE — Assessment & Plan Note (Signed)
continue vitamin D 50,000 units once weekly dose.  After 8 weeks take vitamin D 1000 units daily.  We will recheck your labs at next appointment

## 2023-04-24 ENCOUNTER — Other Ambulatory Visit: Payer: Self-pay | Admitting: Obstetrics and Gynecology

## 2023-04-24 DIAGNOSIS — Z01818 Encounter for other preprocedural examination: Secondary | ICD-10-CM

## 2023-04-26 LAB — NUSWAB VAGINITIS PLUS (VG+)
Candida albicans, NAA: NEGATIVE
Candida glabrata, NAA: NEGATIVE
Chlamydia trachomatis, NAA: NEGATIVE
Neisseria gonorrhoeae, NAA: NEGATIVE
Trich vag by NAA: NEGATIVE

## 2023-05-01 ENCOUNTER — Other Ambulatory Visit: Payer: Self-pay | Admitting: Obstetrics and Gynecology

## 2023-05-01 ENCOUNTER — Other Ambulatory Visit: Payer: Self-pay

## 2023-05-01 ENCOUNTER — Encounter (HOSPITAL_COMMUNITY): Payer: Self-pay | Admitting: Obstetrics and Gynecology

## 2023-05-01 NOTE — H&P (View-Only) (Signed)
Obstetrics and Gynecology Surgical H&P   Note Date: 05/01/2023   OBGYN Clinic: [facility]    Primary Care Provider: Medicine, [facility]   Chief Complaint: Lost IUD, schceduled surgery     History of Present Illness: Alexandra Gamble is a 27 y.o. G3P2011 (No LMP recorded. (Menstrual status: IUD).), seen for the above chief complaint. Her past medical history is significant for BMI 50s, h/o c-section.    Patient seen by me for new patient visit for Mirena IUD (placed 2017) removal in June 2022 and unable to see strings or remove in office; pt desired to keep it in at that time and u/s came back wnl. Patient came back on April 2023 and unable to remove again and repeat u/s showed IUD in normal position. Pt was to get OR removal set up but she desired to keep it in as she thought it was affecting her mood but she realized it wasn't. She is using it for birth control and has been amenorrheic on it for two years. She is getting married and would like it removed.    Review of Systems: Pertinent items noted in HPI and remainder of comprehensive ROS otherwise negative.   Patient Active Problem List   Diagnosis Date Noted   Need for diphtheria-tetanus-pertussis (Tdap) vaccine 03/24/2023   Prediabetes 03/24/2023   Transient hypertension 03/20/2023   BMI 50.0-59.9, adult (HCC) 03/20/2023   IUD strings lost 04/08/2021   Vitamin D deficiency 04/01/2021   Dyslipidemia 04/01/2021   Morbid obesity (HCC) 02/19/2017   Bacterial vaginosis 01/11/2017    Past Medical History:      Past Medical History:  Diagnosis Date   Obesity        Past Surgical History:       Past Surgical History:  Procedure Laterality Date   CESAREAN SECTION N/A 04/07/2016    Procedure: CESAREAN SECTION;  Surgeon: Carolyn Harraway-Smith, MD;  Location: [facility]IRTHING SU[facility];  Service: Obstetrics;  Laterality: N/A;      Past Obstetrical History:                   OB History  Gravida Para Term Preterm AB Living  3 2 2   1 1  SAB IAB Ectopic Multiple Live Births     1     0 1        # Outcome Date GA Lbr Len/2nd Weight Sex Delivery Anes PTL Lv  3 Term 04/07/16 [redacted]w[redacted]d   7 lb 7.4 oz (3.385 kg) F CS-LTranv EPI   LIV  2 Term                    1 SAB                        Past Gynecological History: As per HPI. History of Pap Smear(s): Yes.   Last pap 2023, which was wnl   Social History:  Social History         Socioeconomic History   Marital status: Single      Spouse name: Not on file   Number of children: Not on file   Years of education: Not on file   Highest education level: Not on file  Occupational History   Not on file  Tobacco Use   Smoking status: Never   Smokeless tobacco: Not on file  Substance and Sexual Activity   Alcohol use: No     Drug use: No   Sexual activity: Yes  Other Topics Concern   Not on file  Social History Narrative   Not on file    Social Determinants of Health        Financial Resource Strain: Not on file  Food Insecurity: No Food Insecurity (02/21/2022)    Hunger Vital Sign     Worried About Running Out of Food in the Last Year: Never true     Ran Out of Food in the Last Year: Never true  Transportation Needs: No Transportation Needs (02/21/2022)    PRAPARE - Transportation     Lack of Transportation (Medical): No     Lack of Transportation (Non-Medical): No  Physical Activity: Not on file  Stress: Not on file  Social Connections: Not on file  Intimate Partner Violence: Not on file      Family History:       Family History  Problem Relation Age of Onset   Diabetes Maternal Grandmother     Hypertension Maternal Grandmother     Heart disease Maternal Grandmother        Medications None   Allergies Patient has no known allergies.     Physical Exam:  From 03/20/2023 BP (!) 117/90   Pulse 82   Wt (!) 342 lb (155.1 kg)   BMI 55.20 kg/m  Body mass index is 55.2 kg/m. General  appearance: Well nourished, well developed female in no acute distress.  Cardiovascular: normal s1 and s2.  No murmurs, rubs or gallops. Respiratory:  Clear to auscultation bilateral. Normal respiratory effort Neuro/Psych:  Normal mood and affect.  Skin:  Warm and dry.    Laboratory: none   Radiology: no new imaging Narrative & Impression  CLINICAL DATA:  Missing ParaGard IUD strings   EXAM: ULTRASOUND PELVIS TRANSVAGINAL   TECHNIQUE: Transvaginal ultrasound examination of the pelvis was performed including evaluation of the uterus, ovaries, adnexal regions, and pelvic cul-de-sac.   COMPARISON:  04/22/2021   FINDINGS: Uterus   Measurements: 9.3 x 3.6 x 5.0 cm = volume: 87 mL. Suboptimal visualization of upper uterus. No gross mass. Nabothian cysts at cervix.   Endometrium   Inadequately visualized. However, cine images do demonstrate shadowing echogenic foci within the endometrial complex in mid uterus and questionably at upper uterine segment, likely representing IUD. However position of IUD is uncertain due to limited visualization.   Right ovary   Measurements: 4.0 x 2.8 x 2.5 cm = volume: 14.6 mL. Normal morphology without mass   Left ovary   Not visualized, likely obscured by bowel   Other findings:  No free pelvic fluid or adnexal masses   IMPRESSION: Nonvisualization of LEFT ovary.   Inadequate visualization of endometrial complex.   However, cine images do demonstrate shadowing echogenic foci within the mid uterus and questionably at upper uterine segment, likely representing IUD, though position is uncertain due to limited visualization.     Electronically Signed   By: Mark  Boles M.D.   On: 09/02/2022 14:23     Assessment: patient stable   Plan:  1. Intrauterine contraceptive device threads lost, subsequent encounter For OR for IUD removal  RTC post op     Greysen Devino, Jr MD Attending [facility] (Faculty  Practice) 

## 2023-05-01 NOTE — Progress Notes (Signed)
SDW call  Patient was given pre-op instructions over the phone. Patient verbalized understanding of instructions provided.    PCP - Edwin Dada, NP Cardiologist -  Pulmonary:    PPM/ICD - denies   Chest x-ray - n/a EKG -  n/a Stress Test - ECHO -  Cardiac Cath -   Sleep Study/sleep apnea/CPAP: Diagnosed with sleep apnea, does not wear CPAP  Non-diabetic   Blood Thinner Instructions: denies Aspirin Instructions:denies   ERAS Protcol - Yes, clear fluids until 0430 PRE-SURGERY Ensure or G2-    COVID TEST- n/a    Anesthesia review: No   Patient denies shortness of breath, fever, cough and chest pain over the phone call  Your procedure is scheduled on Tuesday May 02, 2023  Report to [facility] Main Entrance "A" at  0530  A.M., then check in with the Admitting office.  Call this number if you have problems the morning of surgery:  [phone]   If you have any questions prior to your surgery date call [phone]: Open Monday-Friday 8am-4pm If you experience any cold or flu symptoms such as cough, fever, chills, shortness of breath, etc. between now and your scheduled surgery, please notify us at the above number    Remember:  Do not eat after midnight the night before your surgery  You may drink clear liquids until   0430  the morning of your surgery.   Clear liquids allowed are: Water, Non-Citrus Juices (without pulp), Carbonated Beverages, Clear Tea, Black Coffee ONLY (NO MILK, CREAM OR POWDERED CREAMER of any kind), and Gatorade   Take these medicines the morning of surgery with A SIP OF WATER: None  As of today, STOP taking any Aspirin (unless otherwise instructed by your surgeon) Aleve, Naproxen, Ibuprofen, Motrin, Advil, Goody's, BC's, all herbal medications, fish oil, and all vitamins.

## 2023-05-01 NOTE — H&P (Signed)
Obstetrics and Gynecology Surgical H&P   Note Date: 05/01/2023   OB[facility] Clinic[facility]    Primary Care Provider: Medicine, [facility]   Chief Complaint: Lost IUD, schceduled surgery     History of Present Illness: Alexandra Gamble is a 27 y.o. G3P2011 (No LMP recorded. (Menstrual status: IUD).), seen for the above chief complaint. Her past medical history is significant for BMI 50s, h/o c-section.    Patient seen by me for new patient visit for Mirena IUD (placed 2017) removal in June 2022 and unable to see strings or remove in office; pt desired to keep it in at that time and u/s came back wnl. Patient came back on April 2023 and unable to remove again and repeat u/s showed IUD in normal position. Pt was to get OR removal set up but she desired to keep it in as she thought it was affecting her mood but she realized it wasn't. She is using it for birth control and has been amenorrheic on it for two years. She is getting married and would like it removed.    Review of Systems: Pertinent items noted in HPI and remainder of comprehensive ROS otherwise negative.   Patient Active Problem List   Diagnosis Date Noted   Need for diphtheria-tetanus-pertussis (Tdap) vaccine 03/24/2023   Prediabetes 03/24/2023   Transient hypertension 03/20/2023   BMI 50.0-59.9, adult (HCC) 03/20/2023   IUD strings lost 04/08/2021   Vitamin D deficiency 04/01/2021   Dyslipidemia 04/01/2021   Morbid obesity (HCC) 02/19/2017   Bacterial vaginosis 01/11/2017    Past Medical History:      Past Medical History:  Diagnosis Date   Obesity        Past Surgical History:       Past Surgical History:  Procedure Laterality Date   CESAREAN SECTION N/A 04/07/2016    Procedure: CESAREAN SECTION;  Surgeon: Willodean Rosenthal, MD;  Location: [facility]IRTHING SU[facility];  Service: Obstetrics;  Laterality: N/A;      Past Obstetrical History:                   OB History  Gravida Para Term Preterm AB Living  3 2 2   1 1   SAB IAB Ectopic Multiple Live Births     1     0 1        # Outcome Date GA Lbr Len/2nd Weight Sex Delivery Anes PTL Lv  3 Term 04/07/16 [redacted]w[redacted]d   7 lb 7.4 oz (3.385 kg) F CS-LTranv EPI   LIV  2 Term                    1 SAB                        Past Gynecological History: As per HPI. History of Pap Smear(s): Yes.   Last pap 2023, which was wnl   Social History:  Social History         Socioeconomic History   Marital status: Single      Spouse name: Not on file   Number of children: Not on file   Years of education: Not on file   Highest education level: Not on file  Occupational History   Not on file  Tobacco Use   Smoking status: Never   Smokeless tobacco: Not on file  Substance and Sexual Activity   Alcohol use: No  Drug use: No   Sexual activity: Yes  Other Topics Concern   Not on file  Social History Narrative   Not on file    Social Determinants of Health        Financial Resource Strain: Not on file  Food Insecurity: No Food Insecurity (02/21/2022)    Hunger Vital Sign     Worried About Running Out of Food in the Last Year: Never true     Ran Out of Food in the Last Year: Never true  Transportation Needs: No Transportation Needs (02/21/2022)    PRAPARE - Therapist, art (Medical): No     Lack of Transportation (Non-Medical): No  Physical Activity: Not on file  Stress: Not on file  Social Connections: Not on file  Intimate Partner Violence: Not on file      Family History:       Family History  Problem Relation Age of Onset   Diabetes Maternal Grandmother     Hypertension Maternal Grandmother     Heart disease Maternal Grandmother        Medications None   Allergies Patient has no known allergies.     Physical Exam:  From 03/20/2023 BP (!) 117/90   Pulse 82   Wt (!) 342 lb (155.1 kg)   BMI 55.20 kg/m  Body mass index is 55.2 kg/m. General  appearance: Well nourished, well developed female in no acute distress.  Cardiovascular: normal s1 and s2.  No murmurs, rubs or gallops. Respiratory:  Clear to auscultation bilateral. Normal respiratory effort Neuro/Psych:  Normal mood and affect.  Skin:  Warm and dry.    Laboratory: none   Radiology: no new imaging Narrative & Impression  CLINICAL DATA:  Missing ParaGard IUD strings   EXAM: ULTRASOUND PELVIS TRANSVAGINAL   TECHNIQUE: Transvaginal ultrasound examination of the pelvis was performed including evaluation of the uterus, ovaries, adnexal regions, and pelvic cul-de-sac.   COMPARISON:  04/22/2021   FINDINGS: Uterus   Measurements: 9.3 x 3.6 x 5.0 cm = volume: 87 mL. Suboptimal visualization of upper uterus. No gross mass. Nabothian cysts at cervix.   Endometrium   Inadequately visualized. However, cine images do demonstrate shadowing echogenic foci within the endometrial complex in mid uterus and questionably at upper uterine segment, likely representing IUD. However position of IUD is uncertain due to limited visualization.   Right ovary   Measurements: 4.0 x 2.8 x 2.5 cm = volume: 14.6 mL. Normal morphology without mass   Left ovary   Not visualized, likely obscured by bowel   Other findings:  No free pelvic fluid or adnexal masses   IMPRESSION: Nonvisualization of LEFT ovary.   Inadequate visualization of endometrial complex.   However, cine images do demonstrate shadowing echogenic foci within the mid uterus and questionably at upper uterine segment, likely representing IUD, though position is uncertain due to limited visualization.     Electronically Signed   By: Ulyses Southward M.D.   On: 09/02/2022 14:23     Assessment: patient stable   Plan:  1. Intrauterine contraceptive device threads lost, subsequent encounter For OR for IUD removal  RTC post op     Cornelia Copa MD Attending [facility] [employer] [facility] and exercise psychologist)

## 2023-05-02 ENCOUNTER — Ambulatory Visit (HOSPITAL_BASED_OUTPATIENT_CLINIC_OR_DEPARTMENT_OTHER): Payer: 59 | Admitting: Anesthesiology

## 2023-05-02 ENCOUNTER — Ambulatory Visit (HOSPITAL_COMMUNITY): Payer: 59 | Admitting: Anesthesiology

## 2023-05-02 ENCOUNTER — Encounter (HOSPITAL_COMMUNITY)
Admission: RE | Disposition: A | Discharge status: Home / Self Care | Payer: Self-pay | Source: Home / Self Care | Attending: Obstetrics and Gynecology

## 2023-05-02 ENCOUNTER — Other Ambulatory Visit: Payer: Self-pay

## 2023-05-02 ENCOUNTER — Ambulatory Visit (HOSPITAL_COMMUNITY)
Admission: RE | Admit: 2023-05-02 | Discharge: 2023-05-02 | Disposition: A | Discharge status: Home / Self Care | Payer: 59 | Attending: Obstetrics and Gynecology | Admitting: Obstetrics and Gynecology

## 2023-05-02 ENCOUNTER — Encounter (HOSPITAL_COMMUNITY): Payer: Self-pay | Admitting: Obstetrics and Gynecology

## 2023-05-02 DIAGNOSIS — T8389XD Other specified complication of genitourinary prosthetic devices, implants and grafts, subsequent encounter: Secondary | ICD-10-CM

## 2023-05-02 DIAGNOSIS — T8339XA Other mechanical complication of intrauterine contraceptive device, initial encounter: Secondary | ICD-10-CM | POA: Diagnosis not present

## 2023-05-02 DIAGNOSIS — I10 Essential (primary) hypertension: Secondary | ICD-10-CM | POA: Insufficient documentation

## 2023-05-02 DIAGNOSIS — T8389XA Other specified complication of genitourinary prosthetic devices, implants and grafts, initial encounter: Secondary | ICD-10-CM | POA: Insufficient documentation

## 2023-05-02 DIAGNOSIS — G4730 Sleep apnea, unspecified: Secondary | ICD-10-CM

## 2023-05-02 DIAGNOSIS — Z9889 Other specified postprocedural states: Secondary | ICD-10-CM

## 2023-05-02 DIAGNOSIS — Z6841 Body Mass Index (BMI) 40.0 and over, adult: Secondary | ICD-10-CM

## 2023-05-02 DIAGNOSIS — X58XXXA Exposure to other specified factors, initial encounter: Secondary | ICD-10-CM | POA: Insufficient documentation

## 2023-05-02 DIAGNOSIS — T8332XD Displacement of intrauterine contraceptive device, subsequent encounter: Secondary | ICD-10-CM | POA: Diagnosis not present

## 2023-05-02 DIAGNOSIS — Z30432 Encounter for removal of intrauterine contraceptive device: Secondary | ICD-10-CM | POA: Diagnosis not present

## 2023-05-02 HISTORY — DX: Sleep apnea, unspecified: G47.30

## 2023-05-02 HISTORY — PX: HYSTEROSCOPY WITH D & C: SHX1775

## 2023-05-02 LAB — CBC
HCT: 38.9 % (ref 36.0–46.0)
Hemoglobin: 12.3 g/dL (ref 12.0–15.0)
MCH: 25.4 pg — ABNORMAL LOW (ref 26.0–34.0)
MCHC: 31.6 g/dL (ref 30.0–36.0)
MCV: 80.4 fL (ref 80.0–100.0)
Platelets: 322 10*3/uL (ref 150–400)
RBC: 4.84 MIL/uL (ref 3.87–5.11)
RDW: 14.1 % (ref 11.5–15.5)
WBC: 5 10*3/uL (ref 4.0–10.5)
nRBC: 0 % (ref 0.0–0.2)

## 2023-05-02 LAB — POCT PREGNANCY, URINE: Preg Test, Ur: NEGATIVE

## 2023-05-02 SURGERY — DILATATION AND CURETTAGE /HYSTEROSCOPY
Anesthesia: General | Site: Vagina

## 2023-05-02 MED ORDER — POVIDONE-IODINE 10 % EX SWAB
2.0000 | Freq: Once | CUTANEOUS | Status: AC
  Administered 2023-05-02: 2 via TOPICAL

## 2023-05-02 MED ORDER — HEATING PAD PADS: 1.0000 | MEDICATED_PAD | Freq: Two times a day (BID) | 0 refills | Status: AC | PRN

## 2023-05-02 MED ORDER — KETOROLAC TROMETHAMINE 30 MG/ML IJ SOLN
INTRAMUSCULAR | Status: AC
  Filled 2023-05-02: qty 1

## 2023-05-02 MED ORDER — DEXAMETHASONE SODIUM PHOSPHATE 10 MG/ML IJ SOLN
INTRAMUSCULAR | Status: AC
  Filled 2023-05-02: qty 1

## 2023-05-02 MED ORDER — PROPOFOL 10 MG/ML IV BOLUS
INTRAVENOUS | Status: AC
  Filled 2023-05-02: qty 20

## 2023-05-02 MED ORDER — ONDANSETRON HCL 4 MG/2ML IJ SOLN
INTRAMUSCULAR | Status: AC
  Filled 2023-05-02: qty 2

## 2023-05-02 MED ORDER — CHLORHEXIDINE GLUCONATE 0.12 % MT SOLN
15.0000 mL | Freq: Once | OROMUCOSAL | Status: AC
  Administered 2023-05-02: 15 mL via OROMUCOSAL
  Filled 2023-05-02: qty 15

## 2023-05-02 MED ORDER — IBUPROFEN 200 MG PO TABS: 600.0000 mg | ORAL_TABLET | Freq: Four times a day (QID) | ORAL | 0 refills | Status: DC | PRN

## 2023-05-02 MED ORDER — LIDOCAINE HCL 1 % IJ SOLN
INTRAMUSCULAR | Status: DC | PRN
  Administered 2023-05-02: 20 mL

## 2023-05-02 MED ORDER — LIDOCAINE 2% (20 MG/ML) 5 ML SYRINGE
INTRAMUSCULAR | Status: DC | PRN
  Administered 2023-05-02: 60 mg via INTRAVENOUS

## 2023-05-02 MED ORDER — FENTANYL CITRATE (PF) 250 MCG/5ML IJ SOLN
INTRAMUSCULAR | Status: AC
  Filled 2023-05-02: qty 5

## 2023-05-02 MED ORDER — ORAL CARE MOUTH RINSE: 15.0000 mL | Freq: Once | OROMUCOSAL | Status: AC

## 2023-05-02 MED ORDER — MIDAZOLAM HCL 5 MG/5ML IJ SOLN
INTRAMUSCULAR | Status: DC | PRN
  Administered 2023-05-02: 2 mg via INTRAVENOUS

## 2023-05-02 MED ORDER — DEXAMETHASONE SODIUM PHOSPHATE 10 MG/ML IJ SOLN
INTRAMUSCULAR | Status: DC | PRN
  Administered 2023-05-02: 10 mg via INTRAVENOUS

## 2023-05-02 MED ORDER — ONDANSETRON HCL 4 MG/2ML IJ SOLN
INTRAMUSCULAR | Status: DC | PRN
  Administered 2023-05-02: 4 mg via INTRAVENOUS

## 2023-05-02 MED ORDER — SILVER NITRATE-POT NITRATE 75-25 % EX MISC
CUTANEOUS | Status: DC | PRN
  Administered 2023-05-02: 3

## 2023-05-02 MED ORDER — MIDAZOLAM HCL 2 MG/2ML IJ SOLN
INTRAMUSCULAR | Status: AC
  Filled 2023-05-02: qty 2

## 2023-05-02 MED ORDER — SODIUM CHLORIDE 0.9 % IV SOLN: INTRAVENOUS | Status: DC

## 2023-05-02 MED ORDER — PROPOFOL 10 MG/ML IV BOLUS
INTRAVENOUS | Status: DC | PRN
  Administered 2023-05-02: 300 mg via INTRAVENOUS

## 2023-05-02 MED ORDER — AMISULPRIDE (ANTIEMETIC) 5 MG/2ML IV SOLN: 10.0000 mg | Freq: Once | INTRAVENOUS | Status: DC | PRN

## 2023-05-02 MED ORDER — LIDOCAINE HCL (PF) 1 % IJ SOLN
INTRAMUSCULAR | Status: AC
  Filled 2023-05-02: qty 30

## 2023-05-02 MED ORDER — ACETAMINOPHEN 500 MG PO TABS
1000.0000 mg | ORAL_TABLET | Freq: Once | ORAL | Status: AC
  Administered 2023-05-02: 1000 mg via ORAL
  Filled 2023-05-02: qty 2

## 2023-05-02 MED ORDER — LIDOCAINE 2% (20 MG/ML) 5 ML SYRINGE
INTRAMUSCULAR | Status: AC
  Filled 2023-05-02: qty 5

## 2023-05-02 MED ORDER — FENTANYL CITRATE (PF) 250 MCG/5ML IJ SOLN
INTRAMUSCULAR | Status: DC | PRN
  Administered 2023-05-02 (×2): 50 ug via INTRAVENOUS

## 2023-05-02 MED ORDER — FENTANYL CITRATE (PF) 100 MCG/2ML IJ SOLN: 25.0000 ug | INTRAMUSCULAR | Status: DC | PRN

## 2023-05-02 MED ORDER — KETOROLAC TROMETHAMINE 30 MG/ML IJ SOLN
INTRAMUSCULAR | Status: DC | PRN
  Administered 2023-05-02: 30 mg via INTRAVENOUS

## 2023-05-02 MED ORDER — LACTATED RINGERS IV SOLN: INTRAVENOUS | Status: DC

## 2023-05-02 SURGICAL SUPPLY — 12 items
GLOVE BIOGEL PI IND STRL 7.5 (GLOVE) ×1 IMPLANT
GLOVE SURG SS PI 7.0 STRL IVOR (GLOVE) ×1 IMPLANT
GLOVE SURG UNDER POLY LF SZ7 (GLOVE) ×1 IMPLANT
GOWN STRL REUS W/ TWL LRG LVL3 (GOWN DISPOSABLE) ×1 IMPLANT
GOWN STRL REUS W/ TWL XL LVL3 (GOWN DISPOSABLE) ×1 IMPLANT
GOWN STRL REUS W/TWL LRG LVL3 (GOWN DISPOSABLE) ×1
GOWN STRL REUS W/TWL XL LVL3 (GOWN DISPOSABLE) ×1
KIT PROCEDURE FLUENT (KITS) ×1 IMPLANT
PACK VAGINAL MINOR WOMEN LF (CUSTOM PROCEDURE TRAY) ×1 IMPLANT
PAD OB MATERNITY 4.3X12.25 (PERSONAL CARE ITEMS) ×1 IMPLANT
SEAL ROD LENS SCOPE MYOSURE (ABLATOR) IMPLANT
TOWEL GREEN STERILE FF (TOWEL DISPOSABLE) ×2 IMPLANT

## 2023-05-02 NOTE — Interval H&P Note (Signed)
History and Physical Interval Note:  05/02/2023 6:55 AM  Alexandra Gamble  has presented today for surgery, with the diagnosis of IUD Missing Strings.  The various methods of treatment have been discussed with the patient and family. After consideration of risks, benefits and other options for treatment, the patient has consented to  Procedure(s): HYSTEROSCOPIC IUD REMOVAL (N/A) as a surgical intervention.  The patient's history has been reviewed, patient examined, no change in status, stable for surgery.  I have reviewed the patient's chart and labs.  Questions were answered to the patient's satisfaction.     [place] Bing

## 2023-05-02 NOTE — Discharge Instructions (Signed)
   We will discuss your surgery once again in detail at your post-op visit in two to four weeks. If you haven't already done so, please call to make your appointment as soon as possible.  These instructions give you information on caring for yourself after your procedure. Your doctor may also give you more specific instructions. Call your doctor if you have any problems or questions after your procedure. HOME CARE Do not drive for 24 hours. Wait 1 week before doing any activities that wear you out. Do not stand for a long time. Limit stair climbing to once or twice a day. Rest often. Continue with your usual diet. Drink enough fluids to keep your pee (urine) clear or pale yellow. If you have a hard time pooping (constipation), you may: Take a medicine to help you go poop (laxative) as told by your doctor. Eat more fruit and bran. Drink more fluids. Take showers, not baths, for as long as told by your doctor. Do not swim or use a hot tub until your doctor says it is okay. Have someone with you for 1day after the procedure. Do not douche, use tampons, or have sex (intercourse) until seen by your doctor Only take medicines as told by your doctor. Do not take aspirin. It can cause bleeding. Keep all doctor visits. GET HELP IF: You have cramps or pain not helped by medicine. You have new pain in the belly (abdomen). You have a bad smelling fluid coming from your vagina. You have a rash. You have problems with any medicine. GET HELP RIGHT AWAY IF:  You start to bleed more than a regular period. You have a fever. You have chest pain. You have trouble breathing. You feel dizzy or feel like passing out (fainting). You pass out. You have pain in the tops of your shoulders. You have vaginal bleeding with or without clumps of blood (blood clots). MAKE SURE YOU: Understand these instructions. Will watch your condition. Will get help right away if you are not doing well or get  worse. Document Released: 08/16/2008 Document Revised: 11/12/2013 Document Reviewed: 06/06/2013 [facility] Patient Information 2015 [place], [place]. This information is not intended to replace advice given to you by your health care provider. Make sure you discuss any questions you have with your health care provider.

## 2023-05-02 NOTE — Anesthesia Preprocedure Evaluation (Signed)
Anesthesia Evaluation  Patient identified by MRN, date of birth, ID band Patient awake    Reviewed: Allergy & Precautions, NPO status , Patient's Chart, lab work & pertinent test results  Airway Mallampati: III  TM Distance: >3 FB Neck ROM: Full    Dental   Pulmonary sleep apnea    breath sounds clear to auscultation       Cardiovascular hypertension,  Rhythm:Regular Rate:Normal     Neuro/Psych negative neurological ROS     GI/Hepatic negative GI ROS, Neg liver ROS,,,  Endo/Other    Morbid obesity  Renal/GU negative Renal ROS     Musculoskeletal   Abdominal  (+) + obese  Peds  Hematology negative hematology ROS (+)   Anesthesia Other Findings   Reproductive/Obstetrics                             Anesthesia Physical Anesthesia Plan  ASA: 3  Anesthesia Plan: General   Post-op Pain Management: Tylenol PO (pre-op)* and Minimal or no pain anticipated   Induction: Intravenous  PONV Risk Score and Plan: 3 and Dexamethasone, Ondansetron and Treatment may vary due to age or medical condition  Airway Management Planned: LMA  Additional Equipment:   Intra-op Plan:   Post-operative Plan: Extubation in OR  Informed Consent: I have reviewed the patients History and Physical, chart, labs and discussed the procedure including the risks, benefits and alternatives for the proposed anesthesia with the patient or authorized representative who has indicated his/her understanding and acceptance.     Dental advisory given  Plan Discussed with: CRNA  Anesthesia Plan Comments:        Anesthesia Quick Evaluation

## 2023-05-02 NOTE — Transfer of Care (Signed)
Immediate Anesthesia Transfer of Care Note  Patient: Alexandra Gamble  Procedure(s) Performed: EXAM UNDER ANESTHESIA  IUD REMOVAL (Vagina )  Patient Location: PACU  Anesthesia Type:General  Level of Consciousness: drowsy and patient cooperative  Airway & Oxygen Therapy: Patient Spontanous Breathing and Patient connected to face mask oxygen  Post-op Assessment: Report given to RN and Post -op Vital signs reviewed and stable  Post vital signs: Reviewed and stable  Last Vitals:  Vitals Value Taken Time  BP 133/91 05/02/23 0815  Temp    Pulse 90 05/02/23 0816  Resp 18 05/02/23 0816  SpO2 100 % 05/02/23 0816  Vitals shown include unvalidated device data.  Last Pain:  Vitals:   05/02/23 0612  TempSrc:   PainSc: 0-No pain         Complications: No notable events documented.

## 2023-05-02 NOTE — Anesthesia Postprocedure Evaluation (Signed)
Anesthesia Post Note  Patient: Alexandra Gamble  Procedure(s) Performed: EXAM UNDER ANESTHESIA  IUD REMOVAL (Vagina )     Patient location during evaluation: PACU Anesthesia Type: General Level of consciousness: awake and alert Pain management: pain level controlled Vital Signs Assessment: post-procedure vital signs reviewed and stable Respiratory status: spontaneous breathing, nonlabored ventilation, respiratory function stable and patient connected to nasal cannula oxygen Cardiovascular status: blood pressure returned to baseline and stable Postop Assessment: no apparent nausea or vomiting Anesthetic complications: no  No notable events documented.  Last Vitals:  Vitals:   05/02/23 0900 05/02/23 0901  BP: 131/87   Pulse: 77 75  Resp: 18 18  Temp:  36.6 C  SpO2: 95% 97%    Last Pain:  Vitals:   05/02/23 0901  TempSrc:   PainSc: 0-No pain                 Kennieth Rad

## 2023-05-02 NOTE — Op Note (Signed)
Operative Note   05/02/2023  PRE-OP DIAGNOSIS: Lost IUD   POST-OP DIAGNOSIS: Lost IUD   SURGEON: Surgeon(s) and Role:    * [place]offey Bing, MD - Primary  ASSISTANT: None  PROCEDURE: EXAM UNDER ANESTHESIA  IUD REMOVAL   ANESTHESIA: General and paracervical  ESTIMATED BLOOD LOSS: 5mL  DRAINS: no I/O cath done   TOTAL IV FLUIDS: per anesthesia note  SPECIMENS: none  VTE PROPHYLAXIS: SCDs to the bilateral lower extremities  ANTIBIOTICS: not indicated  COMPLICATIONS: none  DISPOSITION: PACU - hemodynamically stable.  CONDITION: stable  FINDINGS: Exam under anesthesia revealed small, mobile  uterus with no masses and bilateral adnexa without masses or fullness. Normal cervix and vagina, no strings seen. IUD removed and strings were wrapped around the IUD and around the T/horizontal stem; IUD intact  PROCEDURE IN DETAIL:  After informed consent was obtained, the patient was taken to the operating room where anesthesia was obtained without difficulty. The patient was positioned in the dorsal lithotomy position in [place] stirrups.  The patient's bladder was catheterized with an in and out foley catheter.  The patient was examined under anesthesia, with the above noted findings.  The bi-valved speculum was placed inside the patient's vagina, and the the anterior lip of the cervix was seen and grasped with the tenaculum.  A paracervical block was achieved with 20mL 1% lidocaine.  The uterine cavity was sounded to 8cm, and then the cervix was able to pass all subsequent clamps.  I used curved kelly clamp, polyp forceps, and IUD hook without success, so I requested to open the hysteroscopy equipment. As this was being set up, I was able to remove the IUD.  Excellent hemostasis was noted, and all instruments were removed, with excellent hemostasis noted throughout.  She was then taken out of dorsal lithotomy. The patient tolerated the procedure well.  Sponge, lap and instrument counts were  correct x2.  The patient was taken to recovery room in excellent condition.  Cornelia Copa MD Attending Center for [facility])

## 2023-05-02 NOTE — Anesthesia Procedure Notes (Signed)
Procedure Name: LMA Insertion Date/Time: 05/02/2023 7:37 AM  Performed by: Adria Dill, CRNAPre-anesthesia Checklist: Patient identified, Emergency Drugs available, Suction available and Patient being monitored Patient Re-evaluated:Patient Re-evaluated prior to induction Oxygen Delivery Method: Circle system utilized Preoxygenation: Pre-oxygenation with 100% oxygen Induction Type: IV induction Ventilation: Mask ventilation without difficulty LMA: LMA inserted LMA Size: 4.0 Number of attempts: 1 Placement Confirmation: positive ETCO2 and breath sounds checked- equal and bilateral Tube secured with: Tape Dental Injury: Teeth and Oropharynx as per pre-operative assessment

## 2023-05-03 ENCOUNTER — Encounter (HOSPITAL_COMMUNITY): Payer: Self-pay | Admitting: Obstetrics and Gynecology

## 2023-05-03 DIAGNOSIS — F4322 Adjustment disorder with anxiety: Secondary | ICD-10-CM | POA: Diagnosis not present

## 2023-05-16 ENCOUNTER — Other Ambulatory Visit: Payer: Self-pay | Admitting: Nurse Practitioner

## 2023-05-16 DIAGNOSIS — E559 Vitamin D deficiency, unspecified: Secondary | ICD-10-CM

## 2023-05-16 MED ORDER — VITAMIN D (ERGOCALCIFEROL) 1.25 MG (50000 UNIT) PO CAPS
50000.0000 [IU] | ORAL_CAPSULE | ORAL | 0 refills | Status: AC
Start: 2023-05-16 — End: ?

## 2023-05-17 ENCOUNTER — Ambulatory Visit (INDEPENDENT_AMBULATORY_CARE_PROVIDER_SITE_OTHER): Payer: 59 | Admitting: Obstetrics and Gynecology

## 2023-05-17 ENCOUNTER — Encounter: Payer: Self-pay | Admitting: Obstetrics and Gynecology

## 2023-05-17 ENCOUNTER — Other Ambulatory Visit: Payer: Self-pay

## 2023-05-17 VITALS — BP 136/80 | HR 93 | Wt 333.9 lb

## 2023-05-17 DIAGNOSIS — Z09 Encounter for follow-up examination after completed treatment for conditions other than malignant neoplasm: Secondary | ICD-10-CM

## 2023-05-17 DIAGNOSIS — T8332XD Displacement of intrauterine contraceptive device, subsequent encounter: Secondary | ICD-10-CM

## 2023-05-17 MED ORDER — MULTIVITAMINS PO CAPS
1.0000 | ORAL_CAPSULE | Freq: Every day | ORAL | Status: AC
Start: 2023-05-17 — End: ?

## 2023-05-17 NOTE — Progress Notes (Signed)
Patient stated she is feeling fine. No bleeding or discomfort.

## 2023-05-17 NOTE — Progress Notes (Signed)
Obstetrics and Gynecology Visit Return Patient Evaluation  Appointment Date: 05/17/2023  Primary Care Provider: Donell Beers  [facility]  Chief Complaint: routine post op visit  History of Present Illness:  Alexandra Gamble is a 27 y.o. s/p 6/11 EUA, Mirena IUD remove due to lost strings and unable to remove in the office; surgery went well and no issues or problems. Pt desired removal in order to conceive.   Interval History: Since that time, she states that she's doing well. No period yet; she had periods initially but didn't have any for a few years on the mirena.   Review of Systems:  as noted in the History of Present Illness.    Medications:  Avianah A. Mcmath "Odette Fraction" had no medications administered during this visit. Current Outpatient Medications  Medication Sig Dispense Refill   phentermine 37.5 MG capsule Take 1 capsule (37.5 mg total) by mouth every morning. 30 capsule 2   Vitamin D, Ergocalciferol, (DRISDOL) 1.25 MG (50000 UNIT) CAPS capsule Take 1 capsule (50,000 Units total) by mouth every 7 (seven) days. 8 capsule 0   No current facility-administered medications for this visit.    Allergies: has No Known Allergies.  Physical Exam:  BP (!) 132/91   Pulse 92   Wt (!) 333 lb 14.4 oz (151.5 kg)   LMP  (LMP Unknown)   BMI 53.89 kg/m  Body mass index is 53.89 kg/m. General appearance: Well nourished, well developed female in no acute distress.  Neuro/Psych:  Normal mood and affect.     Assessment: patient doing well  Plan:  1. Postop check I told her that if she doesn't get a period by mid August to take a home UPT and let us know either way because a woman should have a regular, qmonth period when not on hormones.  I also recommend stopping the phentermine, continuing on the vitamin d and start an MVI  I also told her I recommend contacting her PCP before the August visit to see about starting on  BP meds, and if so, to let them know she wants to conceive so they can put her on safe meds. I also told her diet, exercise, weight loss are fine when trying to conceive.   Return in about 1 year (around 05/16/2024) for annual .  Future Appointments  Date Time Provider Department Center  06/09/2023 10:00 AM El-Khouri, Philippa Sicks, RD [facility]  07/21/2023 11:00 AM Paseda, Baird Kay, FNP [facility] None    Cornelia Copa MD Attending Center for [facility])

## 2023-06-09 ENCOUNTER — Ambulatory Visit: Payer: 59 | Admitting: Dietician

## 2023-07-05 ENCOUNTER — Encounter (INDEPENDENT_AMBULATORY_CARE_PROVIDER_SITE_OTHER): Payer: 59 | Admitting: Family Medicine

## 2023-07-05 DIAGNOSIS — F4322 Adjustment disorder with anxiety: Secondary | ICD-10-CM | POA: Diagnosis not present

## 2023-07-07 ENCOUNTER — Encounter (HOSPITAL_COMMUNITY): Payer: Self-pay

## 2023-07-07 ENCOUNTER — Ambulatory Visit (HOSPITAL_COMMUNITY)
Admission: EM | Admit: 2023-07-07 | Discharge: 2023-07-07 | Disposition: A | Discharge status: Home / Self Care | Payer: 59 | Attending: Emergency Medicine | Admitting: Emergency Medicine

## 2023-07-07 DIAGNOSIS — Z8619 Personal history of other infectious and parasitic diseases: Secondary | ICD-10-CM | POA: Diagnosis not present

## 2023-07-07 DIAGNOSIS — R809 Proteinuria, unspecified: Secondary | ICD-10-CM | POA: Insufficient documentation

## 2023-07-07 DIAGNOSIS — R030 Elevated blood-pressure reading, without diagnosis of hypertension: Secondary | ICD-10-CM | POA: Diagnosis not present

## 2023-07-07 DIAGNOSIS — R319 Hematuria, unspecified: Secondary | ICD-10-CM | POA: Diagnosis not present

## 2023-07-07 DIAGNOSIS — Z113 Encounter for screening for infections with a predominantly sexual mode of transmission: Secondary | ICD-10-CM | POA: Insufficient documentation

## 2023-07-07 DIAGNOSIS — N390 Urinary tract infection, site not specified: Secondary | ICD-10-CM | POA: Insufficient documentation

## 2023-07-07 DIAGNOSIS — Z87440 Personal history of urinary (tract) infections: Secondary | ICD-10-CM | POA: Insufficient documentation

## 2023-07-07 LAB — POCT URINALYSIS DIP (MANUAL ENTRY)
Bilirubin, UA: NEGATIVE
Glucose, UA: NEGATIVE mg/dL
Ketones, POC UA: NEGATIVE mg/dL
Nitrite, UA: NEGATIVE
Protein Ur, POC: 100 mg/dL — AB
Spec Grav, UA: >=1.03 — AB (ref 1.010–1.025)
Urobilinogen, UA: 0.2 E.U./dL
pH, UA: 6.5 (ref 5.0–8.0)

## 2023-07-07 LAB — HIV ANTIBODY (ROUTINE TESTING W REFLEX): HIV Screen 4th Generation wRfx: NONREACTIVE

## 2023-07-07 LAB — POCT URINE PREGNANCY: Preg Test, Ur: NEGATIVE

## 2023-07-07 MED ORDER — NITROFURANTOIN MONOHYD MACRO 100 MG PO CAPS: 100.0000 mg | ORAL_CAPSULE | Freq: Two times a day (BID) | ORAL | 0 refills | Status: AC

## 2023-07-07 MED ORDER — PHENAZOPYRIDINE HCL 200 MG PO TABS: 200.0000 mg | ORAL_TABLET | Freq: Three times a day (TID) | ORAL | 0 refills | Status: DC | PRN

## 2023-07-07 NOTE — ED Triage Notes (Signed)
Pt states urinary frequency since yesterday.  Also states she would like to be tested for STD's denies symptoms.

## 2023-07-07 NOTE — ED Provider Notes (Signed)
HPI  SUBJECTIVE:  Alexandra Gamble is a 27 y.o. female who presents with dysuria, urgency, frequency, vaginal odor starting yesterday.  No cloudy or odorous urine, hematuria, fevers, nausea, vomiting, abdominal, back, pelvic pain, vaginal itching.  She is currently on menses.  She is sexually active with a female partner who is asymptomatic.  She does not have any other partners, but is not sure about him.  STDs are a concern today.  No recent antibiotics.  She tried ibuprofen without improvement in symptoms.  No alleviating factors.  Symptoms worse with urination.  She has a past medical history of UTI, chlamydia, trichomonas, BV and yeast.  No history of pyelonephritis, nephrolithiasis.  LMP: Not sure if she could be pregnant.  PCP: [facility].    Past Medical History:  Diagnosis Date   Obesity    Sleep apnea    Vitamin D deficiency     Past Surgical History:  Procedure Laterality Date   CESAREAN SECTION N/A 04/07/2016   Procedure: CESAREAN SECTION;  Surgeon: Willodean Rosenthal, MD;  Location: [facility] BIRTHING SUITES;  Service: Obstetrics;  Laterality: N/A;   HYSTEROSCOPY WITH D & C N/A 05/02/2023   Procedure: EXAM UNDER ANESTHESIA  IUD REMOVAL;  Surgeon: [place] Bing, MD;  Location: [facility] OR;  Service: Gynecology;  Laterality: N/A;    Family History  Problem Relation Age of Onset   Diabetes Mother    Hypertension Father    Obesity Brother    Diabetes Brother    Diabetes Maternal Grandmother    Hypertension Maternal Grandmother    Heart disease Maternal Grandmother    Leukemia Other     Social History   Tobacco Use   Smoking status: Never  Vaping Use   Vaping status: Never Used  Substance Use Topics   Alcohol use: Yes    Comment: occasionally   Drug use: No    No current facility-administered medications for this encounter.  Current Outpatient Medications:    nitrofurantoin, macrocrystal-monohydrate, (MACROBID) 100 MG capsule, Take 1 capsule (100 mg total) by  mouth 2 (two) times daily for 5 days., Disp: 10 capsule, Rfl: 0   phenazopyridine (PYRIDIUM) 200 MG tablet, Take 1 tablet (200 mg total) by mouth 3 (three) times daily as needed for pain., Disp: 6 tablet, Rfl: 0   Heating Pad PADS, 1 Pad by Does not apply route 2 (two) times daily as needed (cramping). (Patient not taking: Reported on 05/17/2023), Disp: , Rfl: 0   Multiple Vitamin (MULTIVITAMIN) capsule, Take 1 capsule by mouth daily., Disp: , Rfl:    phentermine 37.5 MG capsule, Take 1 capsule (37.5 mg total) by mouth every morning., Disp: 30 capsule, Rfl: 2   Vitamin D, Ergocalciferol, (DRISDOL) 1.25 MG (50000 UNIT) CAPS capsule, Take 1 capsule (50,000 Units total) by mouth every 7 (seven) days., Disp: 8 capsule, Rfl: 0  No Known Allergies   ROS  As noted in HPI.   Physical Exam  BP (!) 162/72 (BP Location: Left Arm)   Pulse 75   Temp 98 F (36.7 C) (Oral)   Resp 16   LMP 07/03/2023 (Exact Date)   SpO2 97%   Constitutional: Well developed, well nourished, no acute distress Eyes:  EOMI, conjunctiva normal bilaterally HENT: Normocephalic, atraumatic,mucus membranes moist Respiratory: Normal inspiratory effort Cardiovascular: Normal rate GI: nondistended soft, nontender.  No suprapubic, flank, lower quadrant tenderness. Back: No CVAT skin: No rash, skin intact Musculoskeletal: no deformities Neurologic: Alert & oriented x 3, no focal neuro deficits Psychiatric:  Speech and behavior appropriate   ED Course   Medications - No data to display  Orders Placed This Encounter  Procedures   RPR    Standing Status:   Standing    Number of Occurrences:   1   HIV Antibody (routine testing w rflx)    Standing Status:   Standing    Number of Occurrences:   1   POCT urinalysis dipstick    Standing Status:   Standing    Number of Occurrences:   1   POCT urine pregnancy    Standing Status:   Standing    Number of Occurrences:   1    Results for orders placed or performed during  the hospital encounter of 07/07/23 (from the past 24 hour(s))  POCT urinalysis dipstick     Status: Abnormal   Collection Time: 07/07/23  5:09 PM  Result Value Ref Range   Color, UA yellow yellow   Clarity, UA clear clear   Glucose, UA negative negative mg/dL   Bilirubin, UA negative negative   Ketones, POC UA negative negative mg/dL   Spec Grav, UA >=9.629 (A) 1.010 - 1.025   Blood, UA large (A) negative   pH, UA 6.5 5.0 - 8.0   Protein Ur, POC =100 (A) negative mg/dL   Urobilinogen, UA 0.2 0.2 or 1.0 E.U./dL   Nitrite, UA Negative Negative   Leukocytes, UA Small (1+) (A) Negative  POCT urine pregnancy     Status: None   Collection Time: 07/07/23  5:50 PM  Result Value Ref Range   Preg Test, Ur Negative Negative   No results found.  ED Clinical Impression  1. Urinary tract infection with hematuria, site unspecified   2. Elevated blood pressure reading without diagnosis of hypertension   3. Screening for STDs (sexually transmitted diseases)      ED Assessment/Plan    1.  Dysuria, urgency and frequency.  UA with small leukocytes, proteinuria, hematuria.  Proteinuria, hematuria most likely from menses.  However, I suspect that she has a urinary tract infection.  Will send home with Macrobid and Pyridium.  Advised patient to increase her fluid intake.  Urine pregnancy negative.   2.  Screening for STDs.  Sent off swab for gonorrhea, chlamydia, trichomonas, BV, yeast, checking HIV, RPR.  Will base further treatment off of labs.  Advised patient to give [place] a working phone number and to refrain from intercourse until all of her labs have been resulted and she and her partner have been treated if necessary.  3.  Elevated blood-pressure reading without diagnosis of hypertension.  Patient to keep an eye on this.  She is to keep a log of her blood pressure and follow-up with her PCP.  Will give patient list of signs and symptoms of hypertensive emergency.     Discussed labs,  MDM,  treatment plan, and plan for follow-up with patient. Discussed sn/sx that should prompt return to the ED. patient agrees with plan.   Meds ordered this encounter  Medications   nitrofurantoin, macrocrystal-monohydrate, (MACROBID) 100 MG capsule    Sig: Take 1 capsule (100 mg total) by mouth 2 (two) times daily for 5 days.    Dispense:  10 capsule    Refill:  0   phenazopyridine (PYRIDIUM) 200 MG tablet    Sig: Take 1 tablet (200 mg total) by mouth 3 (three) times daily as needed for pain.    Dispense:  6 tablet    Refill:  0      *  This clinic note was created using Scientist, clinical (histocompatibility and immunogenetics). Therefore, there may be occasional mistakes despite careful proofreading.  ?    Domenick Gong, MD 07/08/23 1601

## 2023-07-07 NOTE — Discharge Instructions (Addendum)
Urine pregnancy was negative.  Finish the [employer], even if you feel better.  This is for urinary tract infection.  Pyridium will help with your symptoms.  It will turn your urine orange.  Give [place] a working phone number so that we can contact you if needed. Refrain from sexual contact until all of your labs have come back, symptoms have resolved, and your partner(s) are treated if necessary.  We will base further treatment off of your labs.  Decrease your salt intake. diet and exercise will lower your blood pressure significantly. It is important to keep your blood pressure under good control, as having a elevated blood pressure for prolonged periods of time significantly increases your risk of stroke, heart attacks, kidney damage, eye damage, and other problems. Get a validated blood pressure cuff that goes on your arm, not your wrist.  Measure your blood pressure once a day, preferably at the same time every day. Keep a log of this and bring it to your next doctor's appointment.  Bring your blood pressure cuff as well.  Return here in 2 weeks for blood pressure recheck if you're unable to see your primary care physician by then. Return immediately to the ER if you start having chest pain, headache, problems seeing, problems talking, problems walking, if you feel like you're about to pass out, if you do pass out, if you have a seizure, or for any other concerns.  Go to www.goodrx.com  or www.costplusdrugs.com to look up your medications. This will give you a list of where you can find your prescriptions at the most affordable prices. Or ask the pharmacist what the cash price is, or if they have any other discount programs available to help make your medication more affordable. This can be less expensive than what you would pay with insurance.

## 2023-07-08 LAB — RPR
RPR Ser Ql: REACTIVE — AB
RPR Titer: 1:1 {titer}

## 2023-07-10 LAB — CERVICOVAGINAL ANCILLARY ONLY
Bacterial Vaginitis (gardnerella): POSITIVE — AB
Candida Glabrata: NEGATIVE
Candida Vaginitis: NEGATIVE
Chlamydia: NEGATIVE
Comment: NEGATIVE
Comment: NEGATIVE
Comment: NEGATIVE
Comment: NEGATIVE
Comment: NEGATIVE
Comment: NORMAL
Neisseria Gonorrhea: NEGATIVE
Trichomonas: NEGATIVE

## 2023-07-10 LAB — T.PALLIDUM AB, TOTAL: T Pallidum Abs: NONREACTIVE

## 2023-07-11 ENCOUNTER — Telehealth (HOSPITAL_COMMUNITY): Payer: Self-pay | Admitting: Emergency Medicine

## 2023-07-11 MED ORDER — METRONIDAZOLE 500 MG PO TABS: 500.0000 mg | ORAL_TABLET | Freq: Two times a day (BID) | ORAL | 0 refills | Status: DC

## 2023-07-12 DIAGNOSIS — F4322 Adjustment disorder with anxiety: Secondary | ICD-10-CM | POA: Diagnosis not present

## 2023-07-19 ENCOUNTER — Encounter (INDEPENDENT_AMBULATORY_CARE_PROVIDER_SITE_OTHER): Payer: 59 | Admitting: Family Medicine

## 2023-07-21 ENCOUNTER — Ambulatory Visit (INDEPENDENT_AMBULATORY_CARE_PROVIDER_SITE_OTHER): Payer: 59 | Admitting: Nurse Practitioner

## 2023-07-21 ENCOUNTER — Encounter: Payer: Self-pay | Admitting: Nurse Practitioner

## 2023-07-21 VITALS — BP 132/88 | HR 84 | Resp 16 | Wt 325.0 lb

## 2023-07-21 DIAGNOSIS — Z6841 Body Mass Index (BMI) 40.0 and over, adult: Secondary | ICD-10-CM | POA: Diagnosis not present

## 2023-07-21 DIAGNOSIS — N760 Acute vaginitis: Secondary | ICD-10-CM

## 2023-07-21 DIAGNOSIS — B9689 Other specified bacterial agents as the cause of diseases classified elsewhere: Secondary | ICD-10-CM | POA: Diagnosis not present

## 2023-07-21 DIAGNOSIS — R7303 Prediabetes: Secondary | ICD-10-CM | POA: Diagnosis not present

## 2023-07-21 DIAGNOSIS — E559 Vitamin D deficiency, unspecified: Secondary | ICD-10-CM

## 2023-07-21 LAB — POCT GLYCOSYLATED HEMOGLOBIN (HGB A1C): Hemoglobin A1C: 5.2 % (ref 4.0–5.6)

## 2023-07-21 MED ORDER — PHENTERMINE HCL 37.5 MG PO CAPS: 37.5000 mg | ORAL_CAPSULE | ORAL | 2 refills | Status: AC

## 2023-07-21 NOTE — Assessment & Plan Note (Addendum)
On vitamin D 50,000 units once weekly Checking vitamin D levels daily Last vitamin D Lab Results  Component Value Date   VD25OH 10.4 (L) 03/20/2023

## 2023-07-21 NOTE — Progress Notes (Signed)
Established Patient Office Visit  Subjective:  Patient ID: Alexandra Gamble, female    DOB: 18-Jul-1996  Age: 27 y.o. MRN: [id number]  CC:  Chief Complaint  Patient presents with   Prediabetes   Weight Management Screening    HPI Alexandra Gamble is a 27 y.o. female  has a past medical history of Obesity, Sleep apnea, and Vitamin D deficiency.  Patient presents for follow-up for obesity  Obesity.  Currently on phentermine 37.5 milligrams daily, stated that she ran out of the medication about a week ago.  She has started doing walking exercises 3 days a week, reports that her diet can be better.  She missed her appointment with the bariatric surgery.  Patient was at the emergency department recently ,was treated for UTI, has completed full course of antibiotics prescribed.  Metronidazole was prescribed for BV but she did not pick up the medication due to cost.  Today she denies vaginitis, dysuria, vaginal discharge.  Stated that she had dysuria and urinary hesitancy yesterday but that has since resolved.  She will go to [facility] health clinic at [facility] to pick up metronidazole tablets.   Past Medical History:  Diagnosis Date   Obesity    Sleep apnea    Vitamin D deficiency     Past Surgical History:  Procedure Laterality Date   CESAREAN SECTION N/A 04/07/2016   Procedure: CESAREAN SECTION;  Surgeon: Willodean Rosenthal, MD;  Location: [facility] BIRTHING SUITES;  Service: Obstetrics;  Laterality: N/A;   HYSTEROSCOPY WITH D & C N/A 05/02/2023   Procedure: EXAM UNDER ANESTHESIA  IUD REMOVAL;  Surgeon: [place] Bing, MD;  Location: [facility] OR;  Service: Gynecology;  Laterality: N/A;    Family History  Problem Relation Age of Onset   Diabetes Mother    Hypertension Father    Obesity Brother    Diabetes Brother    Diabetes Maternal Grandmother    Hypertension Maternal Grandmother    Heart disease Maternal Grandmother    Leukemia Other     Social History   Socioeconomic History    Marital status: Single    Spouse name: Not on file   Number of children: 2   Years of education: Not on file   Highest education level: Not on file  Occupational History   Not on file  Tobacco Use   Smoking status: Never   Smokeless tobacco: Not on file  Vaping Use   Vaping status: Never Used  Substance and Sexual Activity   Alcohol use: Yes    Comment: occasionally   Drug use: No   Sexual activity: Yes  Other Topics Concern   Not on file  Social History Narrative   Live with her boyfriend and children    Social Determinants of Health   Financial Resource Strain: Not on file  Food Insecurity: No Food Insecurity (02/21/2022)   Hunger Vital Sign    Worried About Running Out of Food in the Last Year: Never true    Ran Out of Food in the Last Year: Never true  Transportation Needs: No Transportation Needs (02/21/2022)   PRAPARE - Administrator, Civil Service (Medical): No    Lack of Transportation (Non-Medical): No  Physical Activity: Not on file  Stress: Not on file  Social Connections: Unknown (04/04/2022)   Received from [facility]   Social Network    Social Network: Not on file  Intimate Partner Violence: Unknown (02/24/2022)   Received from [facility]   HITS  Physically Hurt: Not on file    Insult or Talk Down To: Not on file    Threaten Physical Harm: Not on file    Scream or Curse: Not on file    Outpatient Medications Prior to Visit  Medication Sig Dispense Refill   Heating Pad PADS 1 Pad by Does not apply route 2 (two) times daily as needed (cramping).  0   Multiple Vitamin (MULTIVITAMIN) capsule Take 1 capsule by mouth daily.     Vitamin D, Ergocalciferol, (DRISDOL) 1.25 MG (50000 UNIT) CAPS capsule Take 1 capsule (50,000 Units total) by mouth every 7 (seven) days. 8 capsule 0   metroNIDAZOLE (FLAGYL) 500 MG tablet Take 1 tablet (500 mg total) by mouth 2 (two) times daily. (Patient not taking: Reported on 07/21/2023) 14 tablet 0    phenazopyridine (PYRIDIUM) 200 MG tablet Take 1 tablet (200 mg total) by mouth 3 (three) times daily as needed for pain. (Patient not taking: Reported on 07/21/2023) 6 tablet 0   phentermine 37.5 MG capsule Take 1 capsule (37.5 mg total) by mouth every morning. (Patient not taking: Reported on 07/21/2023) 30 capsule 2   No facility-administered medications prior to visit.    No Known Allergies  ROS Review of Systems  Constitutional:  Negative for activity change, appetite change, chills and fever.  HENT:  Negative for congestion, dental problem, ear discharge, ear pain, hearing loss, rhinorrhea, sinus pressure, sinus pain, sneezing and sore throat.   Eyes:  Negative for pain, discharge, redness and itching.  Respiratory:  Negative for cough, chest tightness, shortness of breath and wheezing.   Cardiovascular:  Negative for chest pain, palpitations and leg swelling.  Gastrointestinal:  Negative for abdominal distention, abdominal pain, anal bleeding, blood in stool, constipation, diarrhea, nausea, rectal pain and vomiting.  Endocrine: Negative for cold intolerance, heat intolerance, polydipsia, polyphagia and polyuria.  Genitourinary:  Negative for difficulty urinating, dysuria, flank pain, frequency, hematuria, menstrual problem, pelvic pain and vaginal bleeding.  Musculoskeletal:  Negative for arthralgias, back pain, gait problem, joint swelling and myalgias.  Skin:  Negative for color change, pallor, rash and wound.  Allergic/Immunologic: Negative for environmental allergies, food allergies and immunocompromised state.  Neurological:  Negative for dizziness, tremors, facial asymmetry, weakness and headaches.  Hematological:  Negative for adenopathy. Does not bruise/bleed easily.  Psychiatric/Behavioral:  Negative for agitation, behavioral problems, confusion, decreased concentration, hallucinations, self-injury and suicidal ideas.       Objective:    Physical Exam Vitals and nursing  note reviewed.  Constitutional:      General: She is not in acute distress.    Appearance: Normal appearance. She is obese. She is not ill-appearing, toxic-appearing or diaphoretic.  HENT:     Mouth/Throat:     Mouth: Mucous membranes are moist.     Pharynx: Oropharynx is clear. No oropharyngeal exudate or posterior oropharyngeal erythema.  Eyes:     General: No scleral icterus.       Right eye: No discharge.        Left eye: No discharge.     Extraocular Movements: Extraocular movements intact.     Conjunctiva/sclera: Conjunctivae normal.  Cardiovascular:     Rate and Rhythm: Normal rate and regular rhythm.     Pulses: Normal pulses.     Heart sounds: Normal heart sounds. No murmur heard.    No friction rub. No gallop.  Pulmonary:     Effort: Pulmonary effort is normal. No respiratory distress.     Breath sounds: Normal breath  sounds. No stridor. No wheezing, rhonchi or rales.  Chest:     Chest wall: No tenderness.  Abdominal:     General: There is no distension.     Palpations: Abdomen is soft.     Tenderness: There is no abdominal tenderness. There is no right CVA tenderness, left CVA tenderness or guarding.  Musculoskeletal:        General: No swelling, tenderness, deformity or signs of injury.     Right lower leg: No edema.     Left lower leg: No edema.  Skin:    General: Skin is warm and dry.     Capillary Refill: Capillary refill takes less than 2 seconds.     Coloration: Skin is not jaundiced or pale.     Findings: No bruising, erythema or lesion.  Neurological:     Mental Status: She is alert and oriented to person, place, and time.     Motor: No weakness.     Coordination: Coordination normal.     Gait: Gait normal.  Psychiatric:        Mood and Affect: Mood normal.        Behavior: Behavior normal.        Thought Content: Thought content normal.        Judgment: Judgment normal.     BP 132/88 (BP Location: Left Arm, Patient Position: Sitting, Cuff Size:  Large)   Pulse 84   Resp 16   Wt (!) 325 lb (147.4 kg)   LMP 07/03/2023 (Exact Date)   SpO2 100%   BMI 52.46 kg/m  Wt Readings from Last 3 Encounters:  07/21/23 (!) 325 lb (147.4 kg)  05/17/23 (!) 333 lb 14.4 oz (151.5 kg)  05/02/23 (!) 342 lb (155.1 kg)    Lab Results  Component Value Date   TSH 1.290 03/20/2023   Lab Results  Component Value Date   WBC 5.0 05/02/2023   HGB 12.3 05/02/2023   HCT 38.9 05/02/2023   MCV 80.4 05/02/2023   PLT 322 05/02/2023   Lab Results  Component Value Date   NA 140 03/20/2023   K 4.3 03/20/2023   CO2 20 03/20/2023   GLUCOSE 131 (H) 03/20/2023   BUN 8 03/20/2023   CREATININE 0.70 03/20/2023   BILITOT 0.3 03/20/2023   ALKPHOS 98 03/20/2023   AST 18 03/20/2023   ALT 17 03/20/2023   PROT 6.8 03/20/2023   ALBUMIN 4.1 03/20/2023   CALCIUM 9.2 03/20/2023   EGFR 121 03/20/2023   Lab Results  Component Value Date   CHOL 177 03/24/2023   Lab Results  Component Value Date   HDL 46 03/24/2023   Lab Results  Component Value Date   LDLCALC 118 (H) 03/24/2023   Lab Results  Component Value Date   TRIG 66 03/24/2023   Lab Results  Component Value Date   CHOLHDL 3.8 03/24/2023   Lab Results  Component Value Date   HGBA1C 5.2 07/21/2023      Assessment & Plan:   Problem List Items Addressed This Visit       Genitourinary   Bacterial vaginosis    She will go to [facility] health clinic at [facility] to pick up free metronidazole tablets Encouraged to take 500 mg twice daily for 7 days Education on BV prevention given    eisseria Gonorrhea Negative  Chlamydia Negative  Trichomonas Negative  Bacterial Vaginitis (gardnerella) Positive Abnormal   Candida Vaginitis Negative  Candida Glabrata Negative  Comment Normal Reference Range Candida  Species - Negative  Comment Normal Reference Range Candida Galbrata - Negative  Comment Normal Reference Range Trichomonas - Negative  Comment Normal Reference Ranger Chlamydia - Negative   Comment Normal Reference Range Neisseria Gonorrhea - Negative  Comment Normal Reference Range Bacterial Vaginosis - Negative  Resulting Agency [facility]          Other   Vitamin D deficiency    On vitamin D 50,000 units once weekly Checking vitamin D levels daily Last vitamin D Lab Results  Component Value Date   VD25OH 10.4 (L) 03/20/2023         Relevant Orders   VITAMIN D 25 Hydroxy (Vit-D Deficiency, Fractures)   BMI 50.0-59.9, adult (HCC) - Primary    Wt Readings from Last 3 Encounters:  07/21/23 (!) 325 lb (147.4 kg)  05/17/23 (!) 333 lb 14.4 oz (151.5 kg)  05/02/23 (!) 342 lb (155.1 kg)  Body mass index is 52.46 kg/m.  Walking exercises 3 days a week 20 minutes each time Has stopped eating out but stated that he diet can be better   She has lost 8 pounds since her last visit  Continue phentermine 37.5 mg once daily Patient encouraged to engage in regular moderate to vigorous exercises at least 150 minutes weekly Patient counseled on low-carb modified diet She is interested in weight loss surgery encouraged to reschedule her appointment with the bariatric surgery      Relevant Medications   phentermine 37.5 MG capsule   Prediabetes   Relevant Orders   POCT glycosylated hemoglobin (Hb A1C) (Completed)    Meds ordered this encounter  Medications   phentermine 37.5 MG capsule    Sig: Take 1 capsule (37.5 mg total) by mouth every morning.    Dispense:  30 capsule    Refill:  2    Follow-up: Return in about 3 months (around 10/21/2023) for obesity.    Donell Beers, FNP

## 2023-07-21 NOTE — Assessment & Plan Note (Addendum)
Wt Readings from Last 3 Encounters:  07/21/23 (!) 325 lb (147.4 kg)  05/17/23 (!) 333 lb 14.4 oz (151.5 kg)  05/02/23 (!) 342 lb (155.1 kg)  Body mass index is 52.46 kg/m.  Walking exercises 3 days a week 20 minutes each time Has stopped eating out but stated that he diet can be better   She has lost 8 pounds since her last visit  Continue phentermine 37.5 mg once daily Patient encouraged to engage in regular moderate to vigorous exercises at least 150 minutes weekly Patient counseled on low-carb modified diet She is interested in weight loss surgery encouraged to reschedule her appointment with the bariatric surgery

## 2023-07-21 NOTE — Assessment & Plan Note (Addendum)
She will go to [facility] health clinic at [facility] to pick up free metronidazole tablets Encouraged to take 500 mg twice daily for 7 days Education on BV prevention given    eisseria Gonorrhea Negative  Chlamydia Negative  Trichomonas Negative  Bacterial Vaginitis (gardnerella) Positive Abnormal   Candida Vaginitis Negative  Candida Glabrata Negative  Comment Normal Reference Range Candida Species - Negative  Comment Normal Reference Range Candida Galbrata - Negative  Comment Normal Reference Range Trichomonas - Negative  Comment Normal Reference Ranger Chlamydia - Negative  Comment Normal Reference Range Neisseria Gonorrhea - Negative  Comment Normal Reference Range Bacterial Vaginosis - Negative  Resulting Agency [facility]

## 2023-07-21 NOTE — Progress Notes (Signed)
Pt presents for prediabetes f/u and weight management

## 2023-07-21 NOTE — Patient Instructions (Signed)
The following basic prevention steps may help lower your risk of getting BV: Not having sex; Limiting your number of sex partners; Not douching; and. Using condoms the right way every time you have sex      BMI 50.0-59.9, adult (HCC)  - phentermine 37.5 MG capsule; Take 1 capsule (37.5 mg total) by mouth every morning.  Dispense: 30 capsule; Refill: 2    It is important that you exercise regularly at least 30 minutes 5 times a week as tolerated  Think about what you will eat, plan ahead. Choose " clean, green, fresh or frozen" over canned, processed or packaged foods which are more sugary, salty and fatty. 70 to 75% of food eaten should be vegetables and fruit. Three meals at set times with snacks allowed between meals, but they must be fruit or vegetables. Aim to eat over a 12 hour period , example 7 am to 7 pm, and STOP after  your last meal of the day. Drink water,generally about 64 ounces per day, no other drink is as healthy. Fruit juice is best enjoyed in a healthy way, by EATING the fruit.  Thanks for choosing [facility] we consider it a privelige to serve you.

## 2023-07-22 LAB — VITAMIN D 25 HYDROXY (VIT D DEFICIENCY, FRACTURES): Vit D, 25-Hydroxy: 21 ng/mL — ABNORMAL LOW (ref 30.0–100.0)

## 2023-10-22 ENCOUNTER — Ambulatory Visit
Admission: EM | Admit: 2023-10-22 | Discharge: 2023-10-22 | Disposition: A | Discharge status: Home / Self Care | Payer: 59 | Attending: Internal Medicine | Admitting: Internal Medicine

## 2023-10-22 DIAGNOSIS — N3001 Acute cystitis with hematuria: Secondary | ICD-10-CM | POA: Diagnosis not present

## 2023-10-22 DIAGNOSIS — Z113 Encounter for screening for infections with a predominantly sexual mode of transmission: Secondary | ICD-10-CM | POA: Diagnosis not present

## 2023-10-22 LAB — POCT URINALYSIS DIP (MANUAL ENTRY)
Glucose, UA: NEGATIVE mg/dL
Nitrite, UA: NEGATIVE
Protein Ur, POC: >=300 mg/dL — AB
Spec Grav, UA: >=1.03 — AB (ref 1.010–1.025)
Urobilinogen, UA: 0.2 U/dL
pH, UA: 6 (ref 5.0–8.0)

## 2023-10-22 LAB — POCT URINE PREGNANCY: Preg Test, Ur: NEGATIVE

## 2023-10-22 MED ORDER — PHENAZOPYRIDINE HCL 200 MG PO TABS
200.0000 mg | ORAL_TABLET | Freq: Three times a day (TID) | ORAL | 0 refills | Status: DC
Start: 2023-10-22 — End: 2023-11-09

## 2023-10-22 MED ORDER — CEPHALEXIN 500 MG PO CAPS
500.0000 mg | ORAL_CAPSULE | Freq: Two times a day (BID) | ORAL | 0 refills | Status: AC
Start: 2023-10-22 — End: 2023-10-29

## 2023-10-22 NOTE — ED Provider Notes (Signed)
[facility]    CSN: [id number] Arrival date & time: 10/22/23  1316      History   Chief Complaint Chief Complaint  Patient presents with   Dysuria    HPI Alexandra Gamble is a 27 y.o. female presents for dysuria.  Patient reports 1 day of urinary burning, urgency, frequency, hematuria.  Denies any fevers, nausea/vomiting, flank pain.  No vaginal discharge or STD concern but she would like screening while here.  Denies a history of recurrent UTIs.  No OTC medications have been used since onset.  No other concerns at this time.   Dysuria   Past Medical History:  Diagnosis Date   Obesity    Sleep apnea    Vitamin D deficiency     Patient Active Problem List   Diagnosis Date Noted   Need for diphtheria-tetanus-pertussis (Tdap) vaccine 03/24/2023   Prediabetes 03/24/2023   Transient hypertension 03/20/2023   BMI 50.0-59.9, adult (HCC) 03/20/2023   Vitamin D deficiency 04/01/2021   Dyslipidemia 04/01/2021   Morbid obesity (HCC) 02/19/2017   Bacterial vaginosis 01/11/2017    Past Surgical History:  Procedure Laterality Date   CESAREAN SECTION N/A 04/07/2016   Procedure: CESAREAN SECTION;  Surgeon: Willodean Rosenthal, MD;  Location: [facility] BIRTHING SUITES;  Service: Obstetrics;  Laterality: N/A;   HYSTEROSCOPY WITH D & C N/A 05/02/2023   Procedure: EXAM UNDER ANESTHESIA  IUD REMOVAL;  Surgeon: [place] Bing, MD;  Location: [facility] OR;  Service: Gynecology;  Laterality: N/A;    OB History     Gravida  3   Para  2   Term  2   Preterm      AB  1   Living  1      SAB  1   IAB      Ectopic      Multiple  0   Live Births  1            Home Medications    Prior to Admission medications   Medication Sig Start Date End Date Taking? Authorizing Provider  cephALEXin (KEFLEX) 500 MG capsule Take 1 capsule (500 mg total) by mouth 2 (two) times daily for 7 days. 10/22/23 10/29/23 Yes Radford Pax, NP  phenazopyridine (PYRIDIUM) 200 MG tablet  Take 1 tablet (200 mg total) by mouth 3 (three) times daily. 10/22/23  Yes Radford Pax, NP  Heating Pad PADS 1 Pad by Does not apply route 2 (two) times daily as needed (cramping). 05/02/23   [place] Bing, MD  metroNIDAZOLE (FLAGYL) 500 MG tablet Take 1 tablet (500 mg total) by mouth 2 (two) times daily. Patient not taking: Reported on 07/21/2023 07/11/23   Merrilee Jansky, MD  Multiple Vitamin (MULTIVITAMIN) capsule Take 1 capsule by mouth daily. 05/17/23   [place] Bing, MD  phentermine 37.5 MG capsule Take 1 capsule (37.5 mg total) by mouth every morning. 07/21/23   Paseda, Baird Kay, FNP  Vitamin D, Ergocalciferol, (DRISDOL) 1.25 MG (50000 UNIT) CAPS capsule Take 1 capsule (50,000 Units total) by mouth every 7 (seven) days. 05/16/23   Donell Beers, FNP    Family History Family History  Problem Relation Age of Onset   Diabetes Mother    Hypertension Father    Obesity Brother    Diabetes Brother    Diabetes Maternal Grandmother    Hypertension Maternal Grandmother    Heart disease Maternal Grandmother    Leukemia Other     Social History Social History  Tobacco Use   Smoking status: Never  Vaping Use   Vaping status: Never Used  Substance Use Topics   Alcohol use: Yes    Comment: occasionally   Drug use: No     Allergies   Patient has no known allergies.   Review of Systems Review of Systems  Genitourinary:  Positive for dysuria.     Physical Exam Triage Vital Signs ED Triage Vitals  Encounter Vitals Group     BP 10/22/23 1357 118/83     Systolic BP Percentile --      Diastolic BP Percentile --      Pulse Rate 10/22/23 1357 90     Resp 10/22/23 1357 17     Temp 10/22/23 1357 98.1 F (36.7 C)     Temp Source 10/22/23 1357 Oral     SpO2 10/22/23 1357 96 %     Weight --      Height --      Head Circumference --      Peak Flow --      Pain Score 10/22/23 1356 4     Pain Loc --      Pain Education --      Exclude from Growth Chart --     No data found.  Updated Vital Signs BP 118/83 (BP Location: Left Arm)   Pulse 90   Temp 98.1 F (36.7 C) (Oral)   Resp 17   LMP  (LMP Unknown)   SpO2 96%   Visual Acuity Right Eye Distance:   Left Eye Distance:   Bilateral Distance:    Right Eye Near:   Left Eye Near:    Bilateral Near:     Physical Exam Vitals and nursing note reviewed.  Constitutional:      Appearance: Normal appearance.  HENT:     Head: Normocephalic and atraumatic.  Eyes:     Pupils: Pupils are equal, round, and reactive to light.  Cardiovascular:     Rate and Rhythm: Normal rate.  Pulmonary:     Effort: Pulmonary effort is normal.  Abdominal:     Tenderness: There is no right CVA tenderness or left CVA tenderness.  Skin:    General: Skin is warm and dry.  Neurological:     General: No focal deficit present.     Mental Status: She is alert and oriented to person, place, and time.  Psychiatric:        Mood and Affect: Mood normal.        Behavior: Behavior normal.      UC Treatments / Results  Labs (all labs ordered are listed, but only abnormal results are displayed) Labs Reviewed  POCT URINALYSIS DIP (MANUAL ENTRY) - Abnormal; Notable for the following components:      Result Value   Clarity, UA cloudy (*)    Bilirubin, UA small (*)    Ketones, POC UA moderate (40) (*)    Spec Grav, UA >=1.030 (*)    Blood, UA large (*)    Protein Ur, POC >=300 (*)    Leukocytes, UA Small (1+) (*)    All other components within normal limits  URINE CULTURE  RPR  HIV ANTIBODY (ROUTINE TESTING W REFLEX)  POCT URINE PREGNANCY  CERVICOVAGINAL ANCILLARY ONLY    EKG   Radiology No results found.  Procedures Procedures (including critical care time)  Medications Ordered in UC Medications - No data to display  Initial Impression / Assessment and Plan / UC Course  I have reviewed the triage vital signs and the nursing notes.  Pertinent labs & imaging results that were available during  my care of the patient were reviewed by me and considered in my medical decision making (see chart for details).     Reviewed exam and symptoms with patient.  No red flags.  UA positive for UTI, will culture and start Keflex.  Pyridium as needed.  STD testing is ordered and will contact for any positive results.  PCP follow-up as symptoms do not improve.  ER precautions reviewed. Final Clinical Impressions(s) / UC Diagnoses   Final diagnoses:  Acute cystitis with hematuria  Screening examination for STD (sexually transmitted disease)     Discharge Instructions      The clinic will contact you with results of the testing done today if positive.  Start Keflex twice daily for 7 days.  You may take Perdiem as needed for your urinary symptoms.  Please note this medication makes your urine orange.  Lots of rest and fluids.  Please follow-up with your PCP if your symptoms do not improve.  Please go to the ER for any worsening symptoms.  I hope you feel better soon!    ED Prescriptions     Medication Sig Dispense Auth. Provider   cephALEXin (KEFLEX) 500 MG capsule Take 1 capsule (500 mg total) by mouth 2 (two) times daily for 7 days. 14 capsule Radford Pax, NP   phenazopyridine (PYRIDIUM) 200 MG tablet Take 1 tablet (200 mg total) by mouth 3 (three) times daily. 6 tablet Radford Pax, NP      PDMP not reviewed this encounter.   Radford Pax, NP 10/22/23 [phone]

## 2023-10-22 NOTE — ED Triage Notes (Signed)
Pt presents with c/o dysuria X 24 hours.   Denies abd pain and back pain.

## 2023-10-22 NOTE — Discharge Instructions (Addendum)
The clinic will contact you with results of the testing done today if positive.  Start Keflex twice daily for 7 days.  You may take Perdiem as needed for your urinary symptoms.  Please note this medication makes your urine orange.  Lots of rest and fluids.  Please follow-up with your PCP if your symptoms do not improve.  Please go to the ER for any worsening symptoms.  I hope you feel better soon!

## 2023-10-23 LAB — URINE CULTURE: Culture: <10000 — AB

## 2023-10-23 LAB — CERVICOVAGINAL ANCILLARY ONLY
Chlamydia: NEGATIVE
Comment: NEGATIVE
Comment: NEGATIVE
Comment: NORMAL
Neisseria Gonorrhea: NEGATIVE
Trichomonas: NEGATIVE

## 2023-10-24 LAB — RPR: RPR Ser Ql: NONREACTIVE

## 2023-10-24 LAB — HIV ANTIBODY (ROUTINE TESTING W REFLEX): HIV Screen 4th Generation wRfx: NONREACTIVE

## 2023-11-09 ENCOUNTER — Encounter (HOSPITAL_COMMUNITY): Payer: Self-pay

## 2023-11-09 ENCOUNTER — Ambulatory Visit (HOSPITAL_COMMUNITY)
Admission: EM | Admit: 2023-11-09 | Discharge: 2023-11-09 | Disposition: A | Discharge status: Home / Self Care | Payer: 59 | Attending: Emergency Medicine | Admitting: Emergency Medicine

## 2023-11-09 DIAGNOSIS — N3001 Acute cystitis with hematuria: Secondary | ICD-10-CM | POA: Diagnosis not present

## 2023-11-09 DIAGNOSIS — J069 Acute upper respiratory infection, unspecified: Secondary | ICD-10-CM | POA: Diagnosis not present

## 2023-11-09 LAB — POCT URINALYSIS DIP (MANUAL ENTRY)
Bilirubin, UA: NEGATIVE
Glucose, UA: NEGATIVE mg/dL
Ketones, POC UA: NEGATIVE mg/dL
Nitrite, UA: NEGATIVE
Protein Ur, POC: 30 mg/dL — AB
Spec Grav, UA: 1.02 (ref 1.010–1.025)
Urobilinogen, UA: 2 U/dL — AB
pH, UA: 7 (ref 5.0–8.0)

## 2023-11-09 LAB — POCT URINE PREGNANCY: Preg Test, Ur: NEGATIVE

## 2023-11-09 MED ORDER — SULFAMETHOXAZOLE-TRIMETHOPRIM 800-160 MG PO TABS: 1.0000 | ORAL_TABLET | Freq: Two times a day (BID) | ORAL | 0 refills | Status: AC

## 2023-11-09 NOTE — Discharge Instructions (Addendum)
We will call you if anything on urine culture requires a change in therapy (about 1-3 days) In the meantime I am treating you for a urinary tract infection. Please take the Bactrim as prescribed, with food to avoid upset stomach. Drink lots of fluids!   For runny nose, congestion, cough, I recommend symptomatic care Zyrtec or allegra, nasal spray, mucinex for congestion, delsym for cough Please return if needed!

## 2023-11-09 NOTE — ED Triage Notes (Signed)
Patient here today with c/o runny nose, cough, and congestion since yesterday. Denies fever. No sick contacts.   Patient also started having pain in urination and frequency with lower abd pain today. Patient states that she had frequent urination and urgency a couple weeks ago but did not show any infection when tested.

## 2023-11-09 NOTE — ED Provider Notes (Signed)
[facility]    CSN: [id number] Arrival date & time: 11/09/23  1320      History   Chief Complaint Chief Complaint  Patient presents with   Cough   Dysuria    HPI Alexandra Gamble is a 27 y.o. female.  Yesterday developed runny nose and dry cough Has not had fever No known sick contacts  Also reporting dysuria, frequency, and lower abdominal discomfort. Started today.  LMP 12/5 Seen 2.5 weeks ago for UTI, culture without significant growth and keflex was discontinued  Past Medical History:  Diagnosis Date   Obesity    Sleep apnea    Vitamin D deficiency     Patient Active Problem List   Diagnosis Date Noted   Need for diphtheria-tetanus-pertussis (Tdap) vaccine 03/24/2023   Prediabetes 03/24/2023   Transient hypertension 03/20/2023   BMI 50.0-59.9, adult (HCC) 03/20/2023   Vitamin D deficiency 04/01/2021   Dyslipidemia 04/01/2021   Morbid obesity (HCC) 02/19/2017   Bacterial vaginosis 01/11/2017    Past Surgical History:  Procedure Laterality Date   CESAREAN SECTION N/A 04/07/2016   Procedure: CESAREAN SECTION;  Surgeon: Willodean Rosenthal, MD;  Location: [facility];  Service: Obstetrics;  Laterality: N/A;   HYSTEROSCOPY WITH D & C N/A 05/02/2023   Procedure: EXAM UNDER ANESTHESIA  IUD REMOVAL;  Surgeon: Hamlin Bing, MD;  Location: [facility] OR;  Service: Gynecology;  Laterality: N/A;    OB History     Gravida  3   Para  2   Term  2   Preterm      AB  1   Living  1      SAB  1   IAB      Ectopic      Multiple  0   Live Births  1            Home Medications    Prior to Admission medications   Medication Sig Start Date End Date Taking? Authorizing Provider  sulfamethoxazole-trimethoprim (BACTRIM DS) 800-160 MG tablet Take 1 tablet by mouth 2 (two) times daily for 3 days. 11/09/23 11/12/23 Yes Karsyn Rochin, Lurena Joiner, PA-C  Heating Pad PADS 1 Pad by Does not apply route 2 (two) times daily as needed (cramping).  05/02/23   Roy Bing, MD  Multiple Vitamin (MULTIVITAMIN) capsule Take 1 capsule by mouth daily. 05/17/23   Harrison Bing, MD  phentermine 37.5 MG capsule Take 1 capsule (37.5 mg total) by mouth every morning. 07/21/23   Paseda, Baird Kay, FNP  Vitamin D, Ergocalciferol, (DRISDOL) 1.25 MG (50000 UNIT) CAPS capsule Take 1 capsule (50,000 Units total) by mouth every 7 (seven) days. 05/16/23   Donell Beers, FNP    Family History Family History  Problem Relation Age of Onset   Diabetes Mother    Hypertension Father    Obesity Brother    Diabetes Brother    Diabetes Maternal Grandmother    Hypertension Maternal Grandmother    Heart disease Maternal Grandmother    Leukemia Other     Social History Social History   Tobacco Use   Smoking status: Never  Vaping Use   Vaping status: Never Used  Substance Use Topics   Alcohol use: Yes    Comment: Rare   Drug use: No     Allergies   Patient has no known allergies.   Review of Systems Review of Systems Per HPI  Physical Exam Triage Vital Signs ED Triage Vitals [11/09/23 1515]  Encounter Vitals Group  BP      Systolic BP Percentile      Diastolic BP Percentile      Pulse      Resp      Temp      Temp src      SpO2      Weight      Height      Head Circumference      Peak Flow      Pain Score 5     Pain Loc      Pain Education      Exclude from Growth Chart    No data found.  Updated Vital Signs BP 127/78 (BP Location: Right Arm)   Pulse 82   Temp 98.8 F (37.1 C) (Oral)   Resp 16   Ht 5\' 5"  (1.651 m)   Wt (!) 310 lb (140.6 kg)   LMP 10/26/2023 (Approximate)   SpO2 98%   BMI 51.59 kg/m   Physical Exam Vitals and nursing note reviewed.  Constitutional:      General: She is not in acute distress. HENT:     Right Ear: Tympanic membrane and ear canal normal.     Left Ear: Tympanic membrane and ear canal normal.     Nose: Rhinorrhea present.     Mouth/Throat:     Mouth: Mucous  membranes are moist.     Pharynx: Oropharynx is clear. No posterior oropharyngeal erythema.  Eyes:     Conjunctiva/sclera: Conjunctivae normal.  Cardiovascular:     Rate and Rhythm: Normal rate and regular rhythm.     Heart sounds: Normal heart sounds.  Pulmonary:     Effort: Pulmonary effort is normal.     Breath sounds: Normal breath sounds.  Abdominal:     Palpations: Abdomen is soft.     Tenderness: There is no abdominal tenderness. There is no right CVA tenderness, left CVA tenderness or guarding.  Neurological:     Mental Status: She is alert and oriented to person, place, and time.     UC Treatments / Results  Labs (all labs ordered are listed, but only abnormal results are displayed) Labs Reviewed  POCT URINALYSIS DIP (MANUAL ENTRY) - Abnormal; Notable for the following components:      Result Value   Clarity, UA cloudy (*)    Blood, UA moderate (*)    Protein Ur, POC =30 (*)    Urobilinogen, UA 2.0 (*)    Leukocytes, UA Large (3+) (*)    All other components within normal limits  URINE CULTURE  POCT URINE PREGNANCY    EKG  Radiology No results found.  Procedures Procedures (including critical care time)  Medications Ordered in UC Medications - No data to display  Initial Impression / Assessment and Plan / UC Course  I have reviewed the triage vital signs and the nursing notes.  Pertinent labs & imaging results that were available during my care of the patient were reviewed by me and considered in my medical decision making (see chart for details).  UPT negative UA with large leuks and moderate RBC Will culture. Treat with bactrim BID x 3 days  Advised symptomatic care and OTC meds for likely viral etiology. Shared decision making defer viral testing today Can return if needed Patient agrees to plan, no questions  Final Clinical Impressions(s) / UC Diagnoses   Final diagnoses:  Acute cystitis with hematuria  Viral URI with cough     Discharge  Instructions  We will call you if anything on urine culture requires a change in therapy (about 1-3 days) In the meantime I am treating you for a urinary tract infection. Please take the Bactrim as prescribed, with food to avoid upset stomach. Drink lots of fluids!   For runny nose, congestion, cough, I recommend symptomatic care Zyrtec or allegra, nasal spray, mucinex for congestion, delsym for cough Please return if needed!     ED Prescriptions     Medication Sig Dispense Auth. Provider   sulfamethoxazole-trimethoprim (BACTRIM DS) 800-160 MG tablet Take 1 tablet by mouth 2 (two) times daily for 3 days. 6 tablet Ayan Heffington, Lurena Joiner, PA-C      PDMP not reviewed this encounter.   Ephram Kornegay, Lurena Joiner, [place] 11/09/23 1628

## 2023-11-10 LAB — URINE CULTURE: Culture: NO GROWTH
